# Patient Record
Sex: Male | Born: 1960
Health system: Southern US, Community
[De-identification: ages and names within clinical notes are randomized; demographics above are authoritative.]

## PROBLEM LIST (undated history)

## (undated) DIAGNOSIS — L309 Dermatitis, unspecified: Secondary | ICD-10-CM

## (undated) DIAGNOSIS — J45909 Unspecified asthma, uncomplicated: Secondary | ICD-10-CM

## (undated) DIAGNOSIS — F32A Depression, unspecified: Secondary | ICD-10-CM

## (undated) DIAGNOSIS — E079 Disorder of thyroid, unspecified: Secondary | ICD-10-CM

---

## 1979-10-28 HISTORY — PX: NOSE SURGERY: SHX723

## 1992-10-27 HISTORY — PX: FOOT SURGERY: SHX648

## 1998-07-21 ENCOUNTER — Emergency Department (HOSPITAL_COMMUNITY): Admission: EM | Admit: 1998-07-21 | Discharge: 1998-07-21 | Payer: Self-pay | Admitting: Emergency Medicine

## 1998-12-18 ENCOUNTER — Emergency Department (HOSPITAL_COMMUNITY): Admission: EM | Admit: 1998-12-18 | Discharge: 1998-12-18 | Payer: Self-pay

## 1999-02-06 ENCOUNTER — Emergency Department (HOSPITAL_COMMUNITY): Admission: EM | Admit: 1999-02-06 | Discharge: 1999-02-06 | Payer: Self-pay | Admitting: Emergency Medicine

## 2003-03-02 ENCOUNTER — Emergency Department (HOSPITAL_COMMUNITY): Admission: EM | Admit: 2003-03-02 | Discharge: 2003-03-02 | Payer: Self-pay | Admitting: Emergency Medicine

## 2006-05-17 ENCOUNTER — Emergency Department (HOSPITAL_COMMUNITY): Admission: EM | Admit: 2006-05-17 | Discharge: 2006-05-17 | Payer: Self-pay | Admitting: Emergency Medicine

## 2006-06-20 ENCOUNTER — Ambulatory Visit (HOSPITAL_COMMUNITY): Admission: RE | Admit: 2006-06-20 | Discharge: 2006-06-20 | Payer: Self-pay | Admitting: Family Medicine

## 2008-06-02 ENCOUNTER — Emergency Department (HOSPITAL_BASED_OUTPATIENT_CLINIC_OR_DEPARTMENT_OTHER): Admission: EM | Admit: 2008-06-02 | Discharge: 2008-06-02 | Payer: Self-pay | Admitting: Emergency Medicine

## 2009-06-08 ENCOUNTER — Encounter: Admission: RE | Admit: 2009-06-08 | Discharge: 2009-06-08 | Payer: Self-pay | Admitting: Family Medicine

## 2014-03-29 ENCOUNTER — Other Ambulatory Visit: Payer: Self-pay | Admitting: Family Medicine

## 2014-03-29 ENCOUNTER — Ambulatory Visit
Admission: RE | Admit: 2014-03-29 | Discharge: 2014-03-29 | Disposition: A | Discharge status: Home / Self Care | Payer: BC Managed Care – PPO | Source: Ambulatory Visit | Attending: Family Medicine | Admitting: Family Medicine

## 2014-03-29 DIAGNOSIS — Z111 Encounter for screening for respiratory tuberculosis: Secondary | ICD-10-CM

## 2014-03-29 DIAGNOSIS — R042 Hemoptysis: Secondary | ICD-10-CM

## 2014-04-03 ENCOUNTER — Other Ambulatory Visit: Payer: Self-pay | Admitting: Family Medicine

## 2014-04-03 DIAGNOSIS — R9389 Abnormal findings on diagnostic imaging of other specified body structures: Secondary | ICD-10-CM

## 2014-04-03 DIAGNOSIS — R7611 Nonspecific reaction to tuberculin skin test without active tuberculosis: Secondary | ICD-10-CM

## 2014-04-06 ENCOUNTER — Encounter (INDEPENDENT_AMBULATORY_CARE_PROVIDER_SITE_OTHER): Payer: Self-pay

## 2014-04-06 ENCOUNTER — Ambulatory Visit
Admission: RE | Admit: 2014-04-06 | Discharge: 2014-04-06 | Disposition: A | Discharge status: Home / Self Care | Payer: BC Managed Care – PPO | Source: Ambulatory Visit | Attending: Family Medicine | Admitting: Family Medicine

## 2014-04-06 DIAGNOSIS — R7611 Nonspecific reaction to tuberculin skin test without active tuberculosis: Secondary | ICD-10-CM

## 2014-04-06 DIAGNOSIS — R9389 Abnormal findings on diagnostic imaging of other specified body structures: Secondary | ICD-10-CM

## 2014-08-06 ENCOUNTER — Emergency Department (HOSPITAL_BASED_OUTPATIENT_CLINIC_OR_DEPARTMENT_OTHER)
Admission: EM | Admit: 2014-08-06 | Discharge: 2014-08-06 | Disposition: A | Discharge status: Home / Self Care | Payer: BC Managed Care – PPO | Attending: Emergency Medicine | Admitting: Emergency Medicine

## 2014-08-06 ENCOUNTER — Emergency Department (HOSPITAL_BASED_OUTPATIENT_CLINIC_OR_DEPARTMENT_OTHER): Payer: BC Managed Care – PPO

## 2014-08-06 ENCOUNTER — Encounter (HOSPITAL_BASED_OUTPATIENT_CLINIC_OR_DEPARTMENT_OTHER): Payer: Self-pay | Admitting: Emergency Medicine

## 2014-08-06 DIAGNOSIS — Z79899 Other long term (current) drug therapy: Secondary | ICD-10-CM | POA: Insufficient documentation

## 2014-08-06 DIAGNOSIS — R109 Unspecified abdominal pain: Secondary | ICD-10-CM

## 2014-08-06 DIAGNOSIS — E079 Disorder of thyroid, unspecified: Secondary | ICD-10-CM | POA: Insufficient documentation

## 2014-08-06 DIAGNOSIS — Z720 Tobacco use: Secondary | ICD-10-CM | POA: Insufficient documentation

## 2014-08-06 DIAGNOSIS — Z791 Long term (current) use of non-steroidal anti-inflammatories (NSAID): Secondary | ICD-10-CM | POA: Insufficient documentation

## 2014-08-06 DIAGNOSIS — J45909 Unspecified asthma, uncomplicated: Secondary | ICD-10-CM | POA: Insufficient documentation

## 2014-08-06 DIAGNOSIS — N201 Calculus of ureter: Secondary | ICD-10-CM

## 2014-08-06 HISTORY — DX: Disorder of thyroid, unspecified: E07.9

## 2014-08-06 HISTORY — DX: Unspecified asthma, uncomplicated: J45.909

## 2014-08-06 LAB — BASIC METABOLIC PANEL
ANION GAP: 15 (ref 5–15)
BUN: 13 mg/dL (ref 6–23)
CHLORIDE: 100 meq/L (ref 96–112)
CO2: 23 meq/L (ref 19–32)
CREATININE: 1.5 mg/dL — AB (ref 0.50–1.35)
Calcium: 9.9 mg/dL (ref 8.4–10.5)
GFR calc non Af Amer: 51 mL/min — ABNORMAL LOW (ref >90)
GFR, EST AFRICAN AMERICAN: 60 mL/min — AB (ref >90)
Glucose, Bld: 103 mg/dL — ABNORMAL HIGH (ref 70–99)
POTASSIUM: 4.3 meq/L (ref 3.7–5.3)
SODIUM: 138 meq/L (ref 137–147)

## 2014-08-06 LAB — URINALYSIS, ROUTINE W REFLEX MICROSCOPIC
BILIRUBIN URINE: NEGATIVE
Glucose, UA: NEGATIVE mg/dL
HGB URINE DIPSTICK: NEGATIVE
Ketones, ur: NEGATIVE mg/dL
Leukocytes, UA: NEGATIVE
NITRITE: NEGATIVE
PH: 7.5 (ref 5.0–8.0)
Protein, ur: NEGATIVE mg/dL
SPECIFIC GRAVITY, URINE: 1.014 (ref 1.005–1.030)
UROBILINOGEN UA: 0.2 mg/dL (ref 0.0–1.0)

## 2014-08-06 LAB — CBC WITH DIFFERENTIAL/PLATELET
BASOS PCT: 0 % (ref 0–1)
Basophils Absolute: 0 10*3/uL (ref 0.0–0.1)
Eosinophils Absolute: 0.1 10*3/uL (ref 0.0–0.7)
Eosinophils Relative: 2 % (ref 0–5)
HEMATOCRIT: 45.1 % (ref 39.0–52.0)
HEMOGLOBIN: 15.1 g/dL (ref 13.0–17.0)
LYMPHS ABS: 0.6 10*3/uL — AB (ref 0.7–4.0)
LYMPHS PCT: 9 % — AB (ref 12–46)
MCH: 26.5 pg (ref 26.0–34.0)
MCHC: 33.5 g/dL (ref 30.0–36.0)
MCV: 79.3 fL (ref 78.0–100.0)
MONO ABS: 0.4 10*3/uL (ref 0.1–1.0)
MONOS PCT: 6 % (ref 3–12)
NEUTROS ABS: 5.8 10*3/uL (ref 1.7–7.7)
NEUTROS PCT: 83 % — AB (ref 43–77)
Platelets: 219 10*3/uL (ref 150–400)
RBC: 5.69 MIL/uL (ref 4.22–5.81)
RDW: 14 % (ref 11.5–15.5)
WBC: 7 10*3/uL (ref 4.0–10.5)

## 2014-08-06 MED ORDER — SODIUM CHLORIDE 0.9 % IV SOLN
INTRAVENOUS | Status: DC
Start: 2014-08-06 — End: 2014-08-06
  Administered 2014-08-06: 14:00:00 via INTRAVENOUS

## 2014-08-06 MED ORDER — HYDROMORPHONE HCL 1 MG/ML IJ SOLN
1.0000 mg | Freq: Once | INTRAMUSCULAR | Status: AC
  Administered 2014-08-06: 1 mg via INTRAVENOUS
  Filled 2014-08-06: qty 1

## 2014-08-06 MED ORDER — ONDANSETRON HCL 4 MG/2ML IJ SOLN
4.0000 mg | Freq: Once | INTRAMUSCULAR | Status: AC
  Administered 2014-08-06: 4 mg via INTRAVENOUS
  Filled 2014-08-06: qty 2

## 2014-08-06 MED ORDER — NAPROXEN 500 MG PO TABS: 500.0000 mg | ORAL_TABLET | Freq: Two times a day (BID) | ORAL | Status: DC

## 2014-08-06 MED ORDER — HYDROCODONE-ACETAMINOPHEN 5-325 MG PO TABS: 1.0000 | ORAL_TABLET | Freq: Four times a day (QID) | ORAL | Status: DC | PRN

## 2014-08-06 MED ORDER — SODIUM CHLORIDE 0.9 % IV BOLUS (SEPSIS)
1000.0000 mL | Freq: Once | INTRAVENOUS | Status: AC
  Administered 2014-08-06: 1000 mL via INTRAVENOUS

## 2014-08-06 MED ORDER — ONDANSETRON 4 MG PO TBDP: 4.0000 mg | ORAL_TABLET | Freq: Three times a day (TID) | ORAL | Status: DC | PRN

## 2014-08-06 NOTE — ED Notes (Signed)
Patient states he is having LLQ pain since about 9am.

## 2014-08-06 NOTE — Discharge Instructions (Signed)
Work note provided. Take pain medicine and Naprosyn as needed. Also have Zofran for nausea and vomiting. Followup with urologist if not improved in 24 hours. Return for any new or worse symptoms.

## 2014-08-06 NOTE — ED Provider Notes (Signed)
CSN: [id number]     Arrival date & time 08/06/14  1235 History   First MD Initiated Contact with Patient 08/06/14 1250     Chief Complaint  Patient presents with  . Abdominal Pain     (Consider location/radiation/quality/duration/timing/severity/associated sxs/prior Treatment) Patient is a 53 y.o. male presenting with abdominal pain. The history is provided by the patient and the spouse.  Abdominal Pain Associated symptoms: dysuria, nausea and vomiting   Associated symptoms: no chest pain, no fever and no shortness of breath    patient with acute onset of left-sided abdominal pain 10 out of 10 radiating to the back radiating down into the left groin area. Patient seemed to think that the pain started just lateral to the umbilicus on the left side. Associated with nausea and vomiting no fevers. Discomfort with urinating but no blood noted in the urine. Patient without history of similar abdominal pain. No history kidney stones. No family history of kidney stones.  Past Medical History  Diagnosis Date  . Asthma   . Thyroid disease    History reviewed. No pertinent past surgical history. No family history on file. History  Substance Use Topics  . Smoking status: Current Every Day Smoker  . Smokeless tobacco: Not on file  . Alcohol Use: Yes     Comment: every few weeks    Review of Systems  Constitutional: Negative for fever.  HENT: Negative for congestion.   Eyes: Negative for visual disturbance.  Respiratory: Negative for shortness of breath.   Cardiovascular: Negative for chest pain.  Gastrointestinal: Positive for nausea, vomiting and abdominal pain.  Genitourinary: Positive for dysuria.  Musculoskeletal: Positive for back pain.  Skin: Negative for rash.  Neurological: Negative for syncope and headaches.  Hematological: Does not bruise/bleed easily.  Psychiatric/Behavioral: Negative for confusion.      Allergies  Review of patient's allergies indicates no known  allergies.  Home Medications   Prior to Admission medications   Medication Sig Start Date End Date Taking? Authorizing Provider  albuterol (PROVENTIL HFA;VENTOLIN HFA) 108 (90 BASE) MCG/ACT inhaler Inhale into the lungs every 6 (six) hours as needed for wheezing or shortness of breath.   Yes Historical Provider, MD  levothyroxine (SYNTHROID, LEVOTHROID) 88 MCG tablet Take 88 mcg by mouth daily before breakfast.   Yes Historical Provider, MD  mometasone (ELOCON) 0.1 % cream Apply 1 application topically daily.   Yes Historical Provider, MD  HYDROcodone-acetaminophen (NORCO/VICODIN) 5-325 MG per tablet Take 1-2 tablets by mouth every 6 (six) hours as needed for moderate pain. 08/06/14   Vanetta Mulders, MD  naproxen (NAPROSYN) 500 MG tablet Take 1 tablet (500 mg total) by mouth 2 (two) times daily. 08/06/14   Vanetta Mulders, MD  ondansetron (ZOFRAN ODT) 4 MG disintegrating tablet Take 1 tablet (4 mg total) by mouth every 8 (eight) hours as needed for nausea or vomiting. 08/06/14   Vanetta Mulders, MD   BP 128/94  Pulse 73  Temp(Src) 98 F (36.7 C) (Oral)  Resp 18  Ht 5\' 9"  (1.753 m)  Wt 160 lb (72.576 kg)  BMI 23.62 kg/m2  SpO2 95% Physical Exam  Nursing note and vitals reviewed. Constitutional: He is oriented to person, place, and time. He appears well-developed and well-nourished. He appears distressed.  HENT:  Head: Normocephalic and atraumatic.  Eyes: Conjunctivae and EOM are normal. Pupils are equal, round, and reactive to light.  Neck: Normal range of motion.  Cardiovascular: Normal rate, regular rhythm and normal heart sounds.   Pulmonary/Chest:  Effort normal and breath sounds normal. No respiratory distress.  Abdominal: Soft. Bowel sounds are normal. There is tenderness.  Tender to palpation on the left side of the abdomen.  Musculoskeletal: Normal range of motion.  Neurological: He is alert and oriented to person, place, and time. No cranial nerve deficit. He exhibits normal  muscle tone. Coordination normal.  Skin: Skin is warm. No rash noted.    ED Course  Procedures (including critical care time) Labs Review Labs Reviewed  BASIC METABOLIC PANEL - Abnormal; Notable for the following:    Glucose, Bld 103 (*)    Creatinine, Ser 1.50 (*)    GFR calc non Af Amer 51 (*)    GFR calc Af Amer 60 (*)    All other components within normal limits  CBC WITH DIFFERENTIAL - Abnormal; Notable for the following:    Neutrophils Relative % 83 (*)    Lymphocytes Relative 9 (*)    Lymphs Abs 0.6 (*)    All other components within normal limits  URINALYSIS, ROUTINE W REFLEX MICROSCOPIC    Imaging Review Ct Renal Stone Study  08/06/2014   CLINICAL DATA:  Left lower quadrant and left flank pain of acute onset  EXAM: CT ABDOMEN AND PELVIS WITHOUT CONTRAST  TECHNIQUE: Multidetector CT imaging of the abdomen and pelvis was performed following the standard protocol without oral or intravenous contrast material administration.  COMPARISON:  None.  FINDINGS: On axial slice 8 series 4, there is a 3 mm nodular opacity in the anterior segment of the left lower lobe. Lung bases are otherwise clear.  No focal liver lesions are identified on this noncontrast enhanced study. Gallbladder wall is not thickened. There is no biliary duct dilatation.  Spleen, pancreas, and adrenals appear normal.  Right kidney shows no evidence of mass or hydronephrosis. There is no right-sided renal or ureteral calculus.  Left kidney appears edematous with perinephric stranding on the left. There is mild hydronephrosis on the left. There is no intrarenal mass or calculus on the left. There is a 3 mm calculus at the left ureterovesical junction. No other ureteral calculi are identified.  In the pelvis the urinary bladder wall is slightly thickened. There is a single small calcification in the left side of the prostate gland. There is no pelvic mass or fluid collection. The appendix appears normal. Terminal ileum  appears normal.  There is no bowel obstruction.  No free air or portal venous air.  There is no ascites, adenopathy, or abscess in the abdomen or pelvis. There is no appreciable abdominal aortic aneurysm. There is degenerative change in the lumbar spine. There are no blastic or lytic bone lesions.  IMPRESSION: 3 mm calculus left ureterovesical junction with mild hydronephrosis on the left. There is left renal edema and moderate perinephric stranding on the left.  Mild thickening of the wall of the urinary bladder is noted. Suspect a degree of cystitis.  No bowel obstruction.  No abscess.  Appendix region appears normal.  3 mm nodular opacity anterior left base region. Followup of this small nodular opacity should be based on Fleischner Society guidelines. If the patient is at high risk for bronchogenic carcinoma, follow-up chest CT at 1 year is recommended. If the patient is at low risk, no follow-up is needed. This recommendation follows the consensus statement: Guidelines for Management of Small Pulmonary Nodules Detected on CT Scans: A Statement from the Fleischner Society as published in Radiology 2005; 237:395-400.   Electronically Signed   By: Chrissie NoaWilliam  Margarita GrizzleWoodruff M.D.   On: 08/06/2014 14:21     EKG Interpretation None      MDM   Final diagnoses:  Left ureteral stone    Patient with acute onset of left-sided abdominal pain left back pain and left groin pain this morning at 9:00. Consistent with the renal stone. CT scan confirmed a 3 mm stone in distal ureter. Associated with some hydronephrosis. Based on that side patient should pass it. Renal function creatinine elevated slightly at 1.5. No other flexor right abnormalities. Patient improved significantly with pain medicine and nausea medicine. Patient has urologist to followup with already. Patient has not had a history of stones in the past.    Vanetta MuldersScott Zonnie Landen, MD 08/06/14 [phone]

## 2015-11-09 IMAGING — CT CT RENAL STONE PROTOCOL
2 of 4 series · 15 of 46 positions shown, 17 images · non-contrast
Comparison: None.

CLINICAL DATA: Left lower quadrant and left flank pain of acute
onset

EXAM:
CT ABDOMEN AND PELVIS WITHOUT CONTRAST
TECHNIQUE: Multidetector CT imaging of the abdomen and pelvis was performed
following the standard protocol without oral or intravenous contrast
material administration.

[Series 2: renal stone > 200 lbs 5.0 b31f · axial · 0.68mm/px · z∈[-420,-35]mm · 12 of 89 slices shown, 14 images]
[im 8/89  soft-tissue]
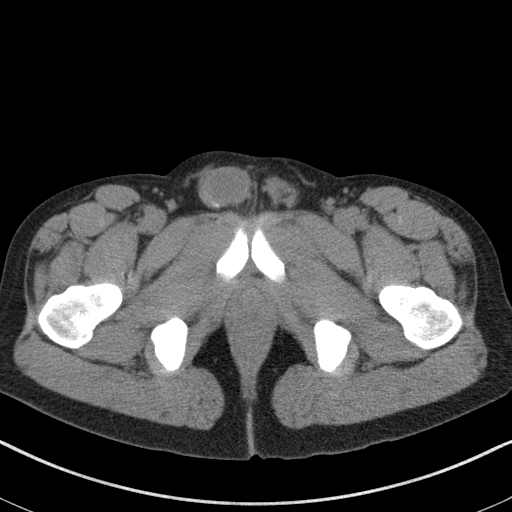
[im 8/89  bone]
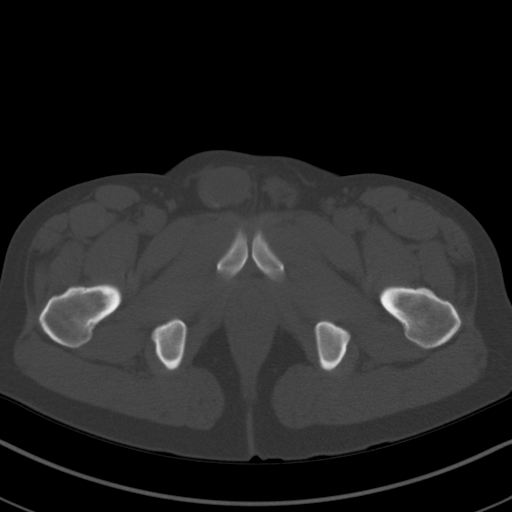
[im 15/89  soft-tissue]
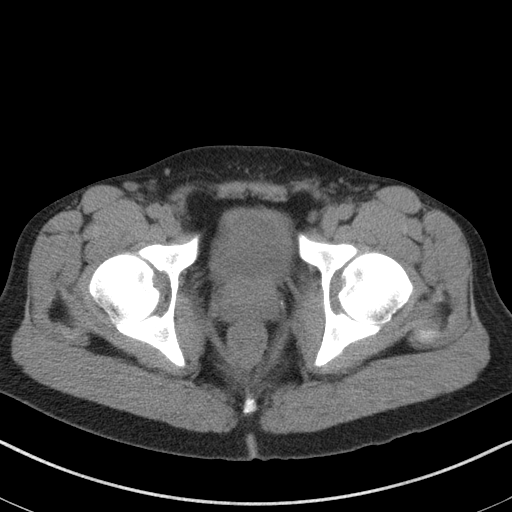
[im 22/89  soft-tissue]
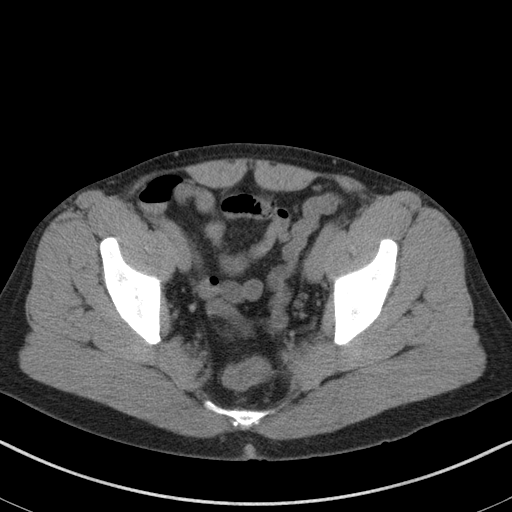
[im 29/89  soft-tissue]
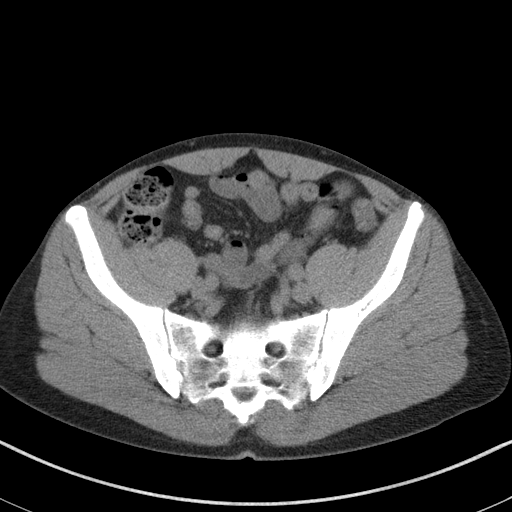
[im 36/89  soft-tissue]
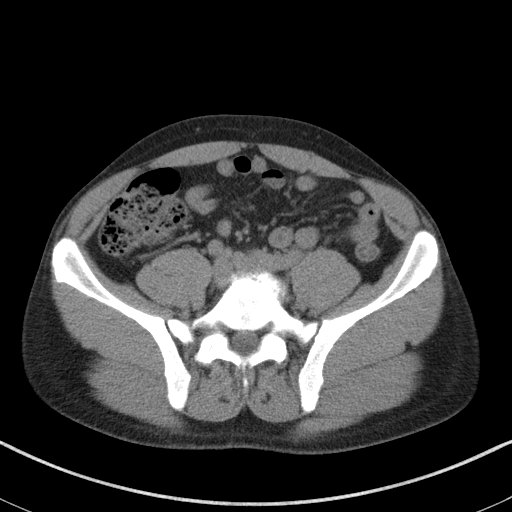
[im 43/89  soft-tissue]
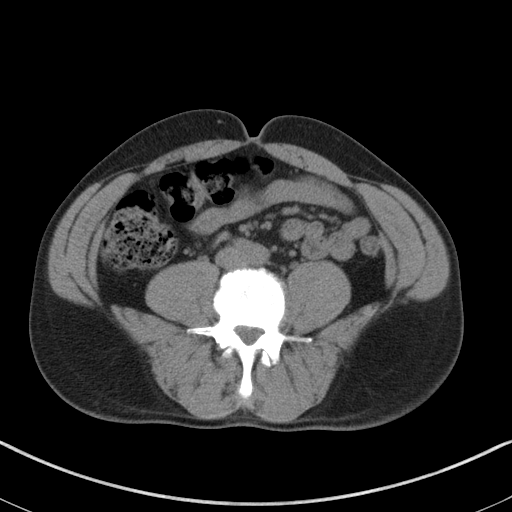
[im 50/89  soft-tissue]
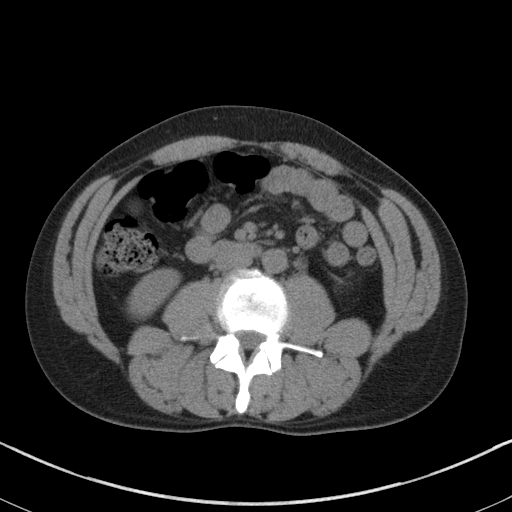
[im 57/89  soft-tissue]
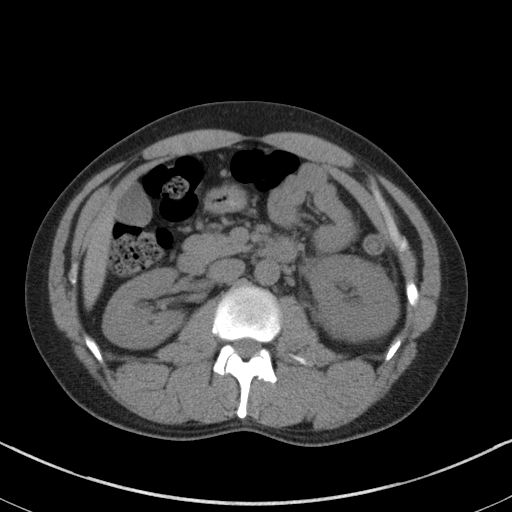
[im 64/89  soft-tissue]
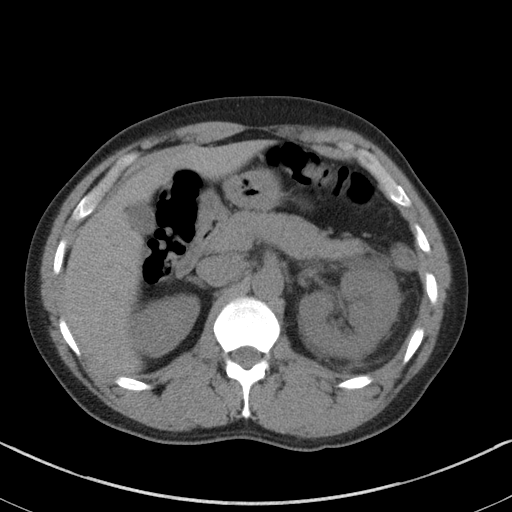
[im 64/89  bone]
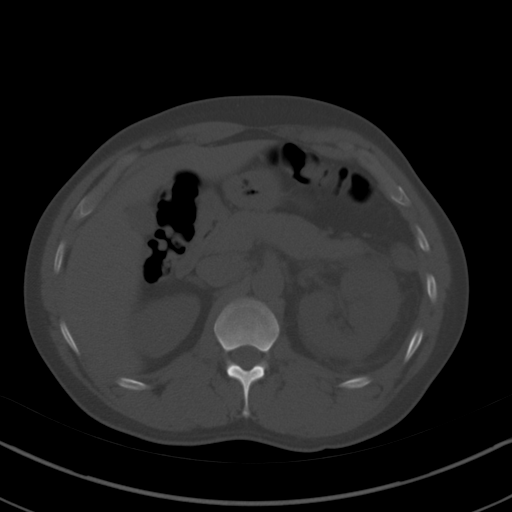
[im 71/89  soft-tissue]
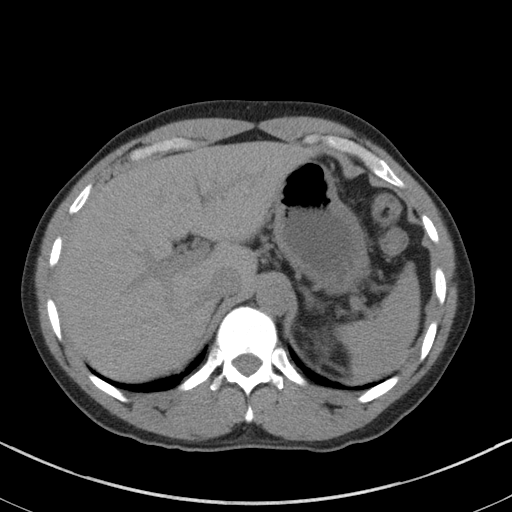
[im 78/89  soft-tissue]
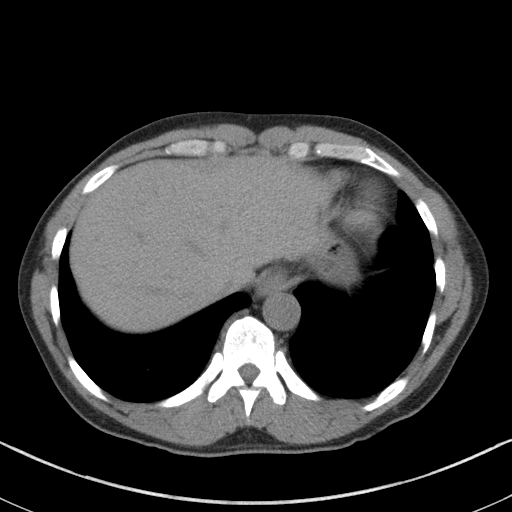
[im 85/89  soft-tissue]
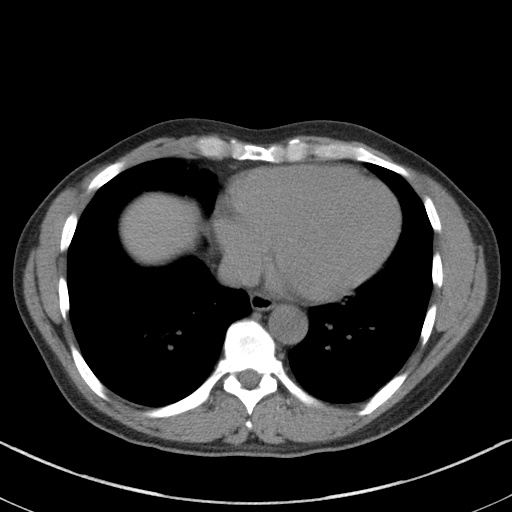

[Series 5: renal stone 3.0 coronal · coronal · 0.80mm/px · 3 of 76 slices shown]
[im 26/76  soft-tissue]
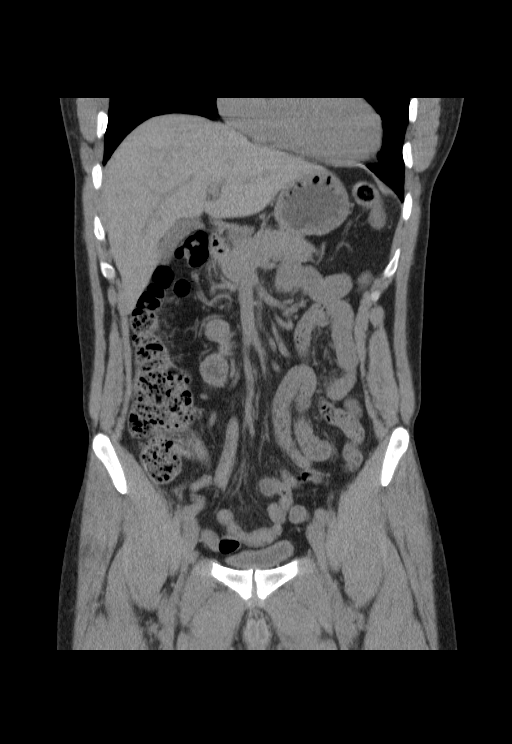
[im 34/76  soft-tissue]
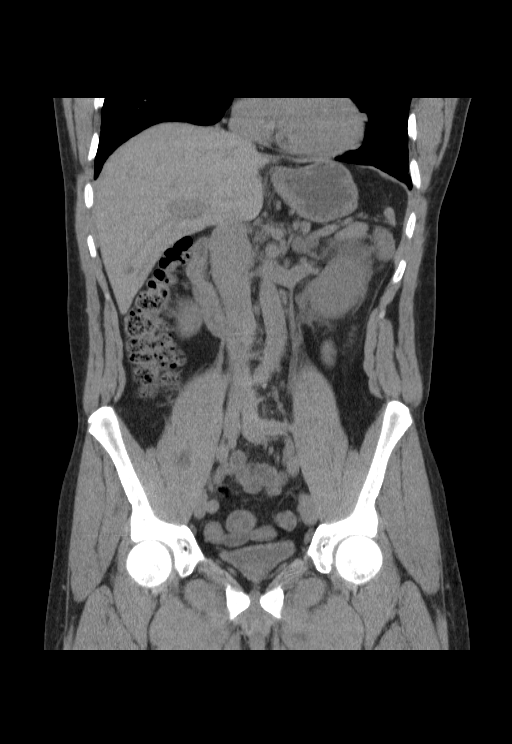
[im 42/76  soft-tissue]
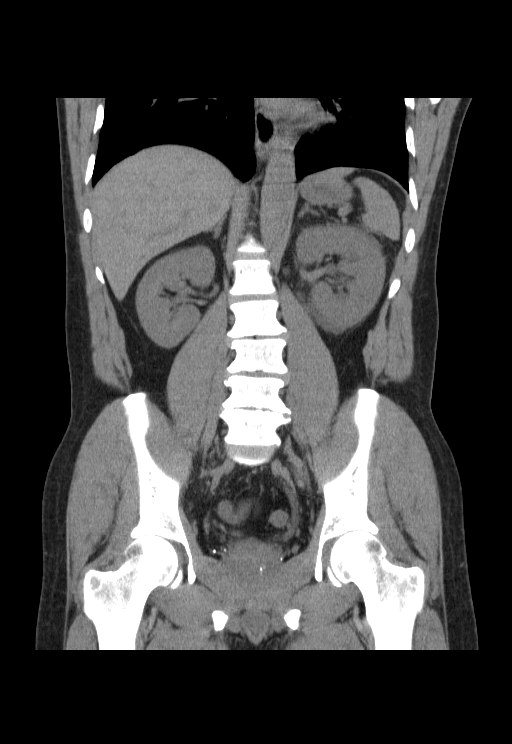

[15 of 46 positions shown; findings below may reference images not displayed]

FINDINGS: On axial slice 8 series 4, there is a 3 mm nodular opacity in the
anterior segment of the left lower lobe. Lung bases are otherwise
clear.

No focal liver lesions are identified on this noncontrast enhanced
study. Gallbladder wall is not thickened. There is no biliary duct
dilatation.

Spleen, pancreas, and adrenals appear normal.

Right kidney shows no evidence of mass or hydronephrosis. There is
no right-sided renal or ureteral calculus.

Left kidney appears edematous with perinephric stranding on the
left. There is mild hydronephrosis on the left. There is no
intrarenal mass or calculus on the left. There is a 3 mm calculus at
the left ureterovesical junction. No other ureteral calculi are
identified.

In the pelvis the urinary bladder wall is slightly thickened. There
is a single small calcification in the left side of the prostate
gland. There is no pelvic mass or fluid collection. The appendix
appears normal. Terminal ileum appears normal.

There is no bowel obstruction.  No free air or portal venous air.

There is no ascites, adenopathy, or abscess in the abdomen or
pelvis. There is no appreciable abdominal aortic aneurysm. There is
degenerative change in the lumbar spine. There are no blastic or
lytic bone lesions.
IMPRESSION: 3 mm calculus left ureterovesical junction with mild hydronephrosis
on the left. There is left renal edema and moderate perinephric
stranding on the left.

Mild thickening of the wall of the urinary bladder is noted. Suspect
a degree of cystitis.

No bowel obstruction.  No abscess.  Appendix region appears normal.

3 mm nodular opacity anterior left base region. Followup of this
small nodular opacity should be based on [HOSPITAL]
guidelines. If the patient is at high risk for bronchogenic
carcinoma, follow-up chest CT at 1 year is recommended. If the
patient is at low risk, no follow-up is needed. This recommendation
follows the consensus statement: Guidelines for Management of Small
Pulmonary Nodules Detected on CT Scans: A Statement from the

## 2015-12-18 ENCOUNTER — Encounter (HOSPITAL_BASED_OUTPATIENT_CLINIC_OR_DEPARTMENT_OTHER): Payer: Self-pay | Admitting: Emergency Medicine

## 2015-12-18 ENCOUNTER — Emergency Department (HOSPITAL_BASED_OUTPATIENT_CLINIC_OR_DEPARTMENT_OTHER)
Admission: EM | Admit: 2015-12-18 | Discharge: 2015-12-18 | Disposition: A | Discharge status: Home / Self Care | Payer: 59 | Attending: Emergency Medicine | Admitting: Emergency Medicine

## 2015-12-18 ENCOUNTER — Emergency Department (HOSPITAL_BASED_OUTPATIENT_CLINIC_OR_DEPARTMENT_OTHER): Payer: 59

## 2015-12-18 DIAGNOSIS — Z87891 Personal history of nicotine dependence: Secondary | ICD-10-CM | POA: Diagnosis not present

## 2015-12-18 DIAGNOSIS — Z7952 Long term (current) use of systemic steroids: Secondary | ICD-10-CM | POA: Diagnosis not present

## 2015-12-18 DIAGNOSIS — Z79899 Other long term (current) drug therapy: Secondary | ICD-10-CM | POA: Insufficient documentation

## 2015-12-18 DIAGNOSIS — Z791 Long term (current) use of non-steroidal anti-inflammatories (NSAID): Secondary | ICD-10-CM | POA: Diagnosis not present

## 2015-12-18 DIAGNOSIS — E079 Disorder of thyroid, unspecified: Secondary | ICD-10-CM | POA: Diagnosis not present

## 2015-12-18 DIAGNOSIS — J45901 Unspecified asthma with (acute) exacerbation: Secondary | ICD-10-CM | POA: Insufficient documentation

## 2015-12-18 DIAGNOSIS — R0602 Shortness of breath: Secondary | ICD-10-CM | POA: Diagnosis present

## 2015-12-18 MED ORDER — ALBUTEROL SULFATE (2.5 MG/3ML) 0.083% IN NEBU
5.0000 mg | INHALATION_SOLUTION | Freq: Once | RESPIRATORY_TRACT | Status: AC
  Administered 2015-12-18: 5 mg via RESPIRATORY_TRACT
  Filled 2015-12-18: qty 6

## 2015-12-18 MED ORDER — IPRATROPIUM-ALBUTEROL 0.5-2.5 (3) MG/3ML IN SOLN
RESPIRATORY_TRACT | Status: AC
  Administered 2015-12-18: 3 mL
  Filled 2015-12-18: qty 3

## 2015-12-18 MED ORDER — PREDNISONE 20 MG PO TABS: 40.0000 mg | ORAL_TABLET | Freq: Every day | ORAL | Status: DC

## 2015-12-18 MED ORDER — IPRATROPIUM BROMIDE 0.02 % IN SOLN
0.5000 mg | Freq: Once | RESPIRATORY_TRACT | Status: AC
  Administered 2015-12-18: 0.5 mg via RESPIRATORY_TRACT
  Filled 2015-12-18: qty 2.5

## 2015-12-18 MED ORDER — ALBUTEROL SULFATE (2.5 MG/3ML) 0.083% IN NEBU
INHALATION_SOLUTION | RESPIRATORY_TRACT | Status: AC
  Administered 2015-12-18: 2.5 mg
  Filled 2015-12-18: qty 3

## 2015-12-18 MED ORDER — METHYLPREDNISOLONE SODIUM SUCC 125 MG IJ SOLR
125.0000 mg | Freq: Once | INTRAMUSCULAR | Status: AC
  Administered 2015-12-18: 125 mg via INTRAVENOUS
  Filled 2015-12-18: qty 2

## 2015-12-18 MED ORDER — MAGNESIUM SULFATE 2 GM/50ML IV SOLN
2.0000 g | Freq: Once | INTRAVENOUS | Status: AC
  Administered 2015-12-18: 2 g via INTRAVENOUS
  Filled 2015-12-18: qty 50

## 2015-12-18 NOTE — ED Provider Notes (Addendum)
CSN: [id number]     Arrival date & time 12/18/15  1556 History   First MD Initiated Contact with Patient 12/18/15 1638     Chief Complaint  Patient presents with  . Shortness of Breath     (Consider location/radiation/quality/duration/timing/severity/associated sxs/prior Treatment) Patient is a 55 y.o. male presenting with shortness of breath. The history is provided by the patient and the spouse.  Shortness of Breath Severity:  Severe Onset quality:  Gradual Duration:  1 day Timing:  Constant Progression:  Worsening Chronicity:  Recurrent Context: known allergens   Context comment:  Pt was outside a lot this weekend trimming bushes Relieved by:  Nothing Worsened by:  Activity and exertion (lying down at night) Ineffective treatments:  Inhaler Associated symptoms: cough and wheezing   Associated symptoms: no chest pain, no fever, no sputum production, no swollen glands and no vomiting   Risk factors: no hx of PE/DVT, no prolonged immobilization, no recent surgery and no tobacco use     Past Medical History  Diagnosis Date  . Asthma   . Thyroid disease    Past Surgical History  Procedure Laterality Date  . Nose surgery     No family history on file. Social History  Substance Use Topics  . Smoking status: Former Games developer  . Smokeless tobacco: None  . Alcohol Use: Yes     Comment: every few weeks    Review of Systems  Constitutional: Negative for fever.  Respiratory: Positive for cough, shortness of breath and wheezing. Negative for sputum production.   Cardiovascular: Negative for chest pain.  Gastrointestinal: Negative for vomiting.  All other systems reviewed and are negative.     Allergies  Review of patient's allergies indicates no known allergies.  Home Medications   Prior to Admission medications   Medication Sig Start Date End Date Taking? Authorizing Provider  albuterol (PROVENTIL HFA;VENTOLIN HFA) 108 (90 BASE) MCG/ACT inhaler Inhale into the lungs  every 6 (six) hours as needed for wheezing or shortness of breath.    Historical Provider, MD  HYDROcodone-acetaminophen (NORCO/VICODIN) 5-325 MG per tablet Take 1-2 tablets by mouth every 6 (six) hours as needed for moderate pain. 08/06/14   Vanetta Mulders, MD  levothyroxine (SYNTHROID, LEVOTHROID) 88 MCG tablet Take 88 mcg by mouth daily before breakfast.    Historical Provider, MD  mometasone (ELOCON) 0.1 % cream Apply 1 application topically daily.    Historical Provider, MD  naproxen (NAPROSYN) 500 MG tablet Take 1 tablet (500 mg total) by mouth 2 (two) times daily. 08/06/14   Vanetta Mulders, MD  ondansetron (ZOFRAN ODT) 4 MG disintegrating tablet Take 1 tablet (4 mg total) by mouth every 8 (eight) hours as needed for nausea or vomiting. 08/06/14   Vanetta Mulders, MD   BP 124/78 mmHg  Pulse 73  Temp(Src) 97.5 F (36.4 C) (Oral)  Resp 18  Ht  (1.753 m)  Wt 165 lb (74.844 kg)  BMI 24.36 kg/m2  SpO2 98% Physical Exam  Constitutional: He is oriented to person, place, and time. He appears well-developed and well-nourished. No distress.  HENT:  Head: Normocephalic and atraumatic.  Mouth/Throat: Oropharynx is clear and moist.  Eyes: Conjunctivae and EOM are normal. Pupils are equal, round, and reactive to light.  Neck: Normal range of motion. Neck supple.  Cardiovascular: Normal rate, regular rhythm and intact distal pulses.   No murmur heard. Pulmonary/Chest: Effort normal. Tachypnea noted. No respiratory distress. He has decreased breath sounds. He has wheezes. He has no rales.  Abdominal: Soft. He exhibits no distension. There is no tenderness. There is no rebound and no guarding.  Musculoskeletal: Normal range of motion. He exhibits no edema or tenderness.  Neurological: He is alert and oriented to person, place, and time.  Skin: Skin is warm and dry. No rash noted. No erythema.  Psychiatric: He has a normal mood and affect. His behavior is normal.  Nursing note and vitals  reviewed.   ED Course  Procedures (including critical care time) Labs Review Labs Reviewed - No data to display  Imaging Review Dg Chest 2 View  12/18/2015  CLINICAL DATA:  Asthma.  Shortness of breath. EXAM: CHEST  2 VIEW COMPARISON:  04/06/2014 FINDINGS: Calcified right infrahilar lymph node. Old granulomatous disease with calcified granuloma in the right lower lobe. Cardiac and mediastinal margins appear normal. The lungs appear otherwise clear. IMPRESSION: 1. No acute thoracic findings. Electronically Signed   By: Gaylyn Rong M.D.   On: 12/18/2015 16:52   I have personally reviewed and evaluated these images and lab results as part of my medical decision-making.   EKG Interpretation None      MDM   Final diagnoses:  Asthma exacerbation    Pt with typical asthma exacerbation  Symptoms starting yesterday.  Long hx of asthma on advair and albuterol which are not helping at home.  No infectious sx, productive cough or other complaints.  Severely diminished breath sounds on exam.  will give steroids, mag, albuterol/atrovent and recheck. EKG and CXR without acute findings.   6:25 PM Improvement with mag, steroids and a second albuterol and Atrovent. Now breath sounds are heard throughout the lung field still slightly tight but improved. Patient is able to ambulate without desaturation or tachypnea. He states he is feeling better.  Gwyneth Sprout, MD 12/18/15 4034  Gwyneth Sprout, MD 12/18/15 [phone]

## 2015-12-18 NOTE — Discharge Instructions (Signed)
Asthma, Adult Asthma is a condition of the lungs in which the airways tighten and narrow. Asthma can make it hard to breathe. Asthma cannot be cured, but medicine and lifestyle changes can help control it. Asthma may be started (triggered) by:  Animal skin flakes (dander).  Dust.  Cockroaches.  Pollen.  Mold.  Smoke.  Cleaning products.  Hair sprays or aerosol sprays.  Paint fumes or strong smells.  Cold air, weather changes, and winds.  Crying or laughing hard.  Stress.  Certain medicines or drugs.  Foods, such as dried fruit, potato chips, and sparkling grape juice.  Infections or conditions (colds, flu).  Exercise.  Certain medical conditions or diseases.  Exercise or tiring activities. HOME CARE   Take medicine as told by your doctor.  Use a peak flow meter as told by your doctor. A peak flow meter is a tool that measures how well the lungs are working.  Record and keep track of the peak flow meter's readings.  Understand and use the asthma action plan. An asthma action plan is a written plan for taking care of your asthma and treating your attacks.  To help prevent asthma attacks:  Do not smoke. Stay away from secondhand smoke.  Change your heating and air conditioning filter often.  Limit your use of fireplaces and wood stoves.  Get rid of pests (such as roaches and mice) and their droppings.  Throw away plants if you see mold on them.  Clean your floors. Dust regularly. Use cleaning products that do not smell.  Have someone vacuum when you are not home. Use a vacuum cleaner with a HEPA filter if possible.  Replace carpet with wood, tile, or vinyl flooring. Carpet can trap animal skin flakes and dust.  Use allergy-proof pillows, mattress covers, and box spring covers.  Wash bed sheets and blankets every week in hot water and dry them in a dryer.  Use blankets that are made of polyester or cotton.  Clean bathrooms and kitchens with bleach.  If possible, have someone repaint the walls in these rooms with mold-resistant paint. Keep out of the rooms that are being cleaned and painted.  Wash hands often. GET HELP IF:  You have make a whistling sound when breaking (wheeze), have shortness of breath, or have a cough even if taking medicine to prevent attacks.  The colored mucus you cough up (sputum) is thicker than usual.  The colored mucus you cough up changes from clear or white to yellow, green, gray, or bloody.  You have problems from the medicine you are taking such as:  A rash.  Itching.  Swelling.  Trouble breathing.  You need reliever medicines more than 2-3 times a week.  Your peak flow measurement is still at 50-79% of your personal best after following the action plan for 1 hour.  You have a fever. GET HELP RIGHT AWAY IF:   You seem to be worse and are not responding to medicine during an asthma attack.  You are short of breath even at rest.  You get short of breath when doing very little activity.  You have trouble eating, drinking, or talking.  You have chest pain.  You have a fast heartbeat.  Your lips or fingernails start to turn blue.  You are light-headed, dizzy, or faint.  Your peak flow is less than 50% of your personal best.   This information is not intended to replace advice given to you by your health care provider. Make sure   you discuss any questions you have with your health care provider.   Document Released: 03/31/2008 Document Revised: 07/04/2015 Document Reviewed: 05/12/2013 Elsevier Interactive Patient Education 2016 [employer]  

## 2015-12-18 NOTE — ED Notes (Signed)
SOB and tightness since last night.  Neb tx at home not working.  Has used 2 nebs plus the inhaler with no improvement.

## 2016-05-11 ENCOUNTER — Emergency Department (HOSPITAL_BASED_OUTPATIENT_CLINIC_OR_DEPARTMENT_OTHER): Payer: 59

## 2016-05-11 ENCOUNTER — Encounter (HOSPITAL_BASED_OUTPATIENT_CLINIC_OR_DEPARTMENT_OTHER): Payer: Self-pay | Admitting: *Deleted

## 2016-05-11 ENCOUNTER — Emergency Department (HOSPITAL_BASED_OUTPATIENT_CLINIC_OR_DEPARTMENT_OTHER)
Admission: EM | Admit: 2016-05-11 | Discharge: 2016-05-11 | Disposition: A | Discharge status: Home / Self Care | Payer: 59 | Attending: Emergency Medicine | Admitting: Emergency Medicine

## 2016-05-11 DIAGNOSIS — S61411A Laceration without foreign body of right hand, initial encounter: Secondary | ICD-10-CM | POA: Diagnosis present

## 2016-05-11 DIAGNOSIS — Z87891 Personal history of nicotine dependence: Secondary | ICD-10-CM | POA: Diagnosis not present

## 2016-05-11 DIAGNOSIS — J45909 Unspecified asthma, uncomplicated: Secondary | ICD-10-CM | POA: Diagnosis not present

## 2016-05-11 DIAGNOSIS — L03113 Cellulitis of right upper limb: Secondary | ICD-10-CM | POA: Insufficient documentation

## 2016-05-11 DIAGNOSIS — Y929 Unspecified place or not applicable: Secondary | ICD-10-CM | POA: Diagnosis not present

## 2016-05-11 DIAGNOSIS — Y999 Unspecified external cause status: Secondary | ICD-10-CM | POA: Diagnosis not present

## 2016-05-11 DIAGNOSIS — Y939 Activity, unspecified: Secondary | ICD-10-CM | POA: Diagnosis not present

## 2016-05-11 DIAGNOSIS — IMO0002 Reserved for concepts with insufficient information to code with codable children: Secondary | ICD-10-CM

## 2016-05-11 DIAGNOSIS — W260XXA Contact with knife, initial encounter: Secondary | ICD-10-CM | POA: Insufficient documentation

## 2016-05-11 MED ORDER — CEPHALEXIN 500 MG PO CAPS: 500.0000 mg | ORAL_CAPSULE | Freq: Two times a day (BID) | ORAL | Status: DC

## 2016-05-11 MED ORDER — IBUPROFEN 800 MG PO TABS: 800.0000 mg | ORAL_TABLET | Freq: Three times a day (TID) | ORAL | Status: DC

## 2016-05-11 MED ORDER — TETANUS-DIPHTH-ACELL PERTUSSIS 5-2.5-18.5 LF-MCG/0.5 IM SUSP
0.5000 mL | Freq: Once | INTRAMUSCULAR | Status: AC
  Administered 2016-05-11: 0.5 mL via INTRAMUSCULAR
  Filled 2016-05-11: qty 0.5

## 2016-05-11 NOTE — ED Notes (Signed)
Laceration to right hand last night.  Bleeding controlled.

## 2016-05-11 NOTE — ED Provider Notes (Signed)
CSN: [id number]     Arrival date & time 05/11/16  1337 History   First MD Initiated Contact with Patient 05/11/16 1503     Chief Complaint  Patient presents with  . Extremity Laceration   Patient is a 55 y.o. male presenting with skin laceration.  Laceration Location:  Shoulder/arm Shoulder/arm laceration location:  R hand Length (cm):  1.5 Depth:  Cutaneous Quality: straight   Bleeding: controlled   Time since incident:  1 day Laceration mechanism:  Knife Foreign body present:  No foreign bodies Relieved by:  Pressure Worsened by:  Movement Tetanus status:  Out of date   Mr. Jablonowski is a 55 year old male presenting with laceration. Patient states he was using an X-Acto blade last evening when he slipped and cut the dorsal surface of his right hand. The laceration is distal to the thumb MCP joint. Laceration occurred over 12 hours ago. He applied pressure which stopped the bleeding last evening. He presents today because he woke with redness of the skin around the laceration. Denies purulent drainage from the wound. Laceration continues to ooze blood when he moves his thumb. Denies use of blood thinners. Denies swelling of the hand or inability to move the thumb. He is unsure of his last tetanus shot. Denies fevers, chills, nausea or vomiting.  Past Medical History  Diagnosis Date  . Asthma   . Thyroid disease    Past Surgical History  Procedure Laterality Date  . Nose surgery     History reviewed. No pertinent family history. Social History  Substance Use Topics  . Smoking status: Former Games developer  . Smokeless tobacco: None  . Alcohol Use: Yes     Comment: every few weeks    Review of Systems  All other systems reviewed and are negative.     Allergies  Review of patient's allergies indicates no known allergies.  Home Medications   Prior to Admission medications   Medication Sig Start Date End Date Taking? Authorizing Provider  albuterol (PROVENTIL HFA;VENTOLIN  HFA) 108 (90 BASE) MCG/ACT inhaler Inhale into the lungs every 6 (six) hours as needed for wheezing or shortness of breath.    Historical Provider, MD  cephALEXin (KEFLEX) 500 MG capsule Take 1 capsule (500 mg total) by mouth 2 (two) times daily. 05/11/16   Jaclyne Haverstick, PA-C  ibuprofen (ADVIL,MOTRIN) 800 MG tablet Take 1 tablet (800 mg total) by mouth 3 (three) times daily. 05/11/16   Nadav Swindell, PA-C  levothyroxine (SYNTHROID, LEVOTHROID) 88 MCG tablet Take 88 mcg by mouth daily before breakfast.    Historical Provider, MD  mometasone (ELOCON) 0.1 % cream Apply 1 application topically daily.    Historical Provider, MD   BP 122/89 mmHg  Pulse 88  Temp(Src) 98.9 F (37.2 C) (Oral)  Resp 18  Ht  (1.778 m)  Wt 77.111 kg  BMI 24.39 kg/m2  SpO2 96% Physical Exam  Constitutional: He appears well-developed and well-nourished. No distress.  HENT:  Head: Normocephalic and atraumatic.  Eyes: Conjunctivae are normal. Right eye exhibits no discharge. Left eye exhibits no discharge. No scleral icterus.  Neck: Normal range of motion.  Cardiovascular: Normal rate and regular rhythm.   Pulmonary/Chest: Effort normal. No respiratory distress.  Musculoskeletal: Normal range of motion.  Full range of motion of the right thumb intact. No swelling of the hand, MCP or thumb. No indication there is a tendon injury  Neurological: He is alert. Coordination normal.  Sensation to light touch intact over the thumb  Skin: Skin is warm and dry. There is erythema.  1.5 cm linear laceration noted to the dorsal surface of the right hand. Laceration lies approximately 5 mm from the base of the first MCP joint. Laceration is superficial with a depth of approximately 1 mm. Wound edges approximate well. Bleeding controlled without cause at this time. Mild surrounding erythema extending approximately 4 cm from the wound. No streaking. No induration or fluctuance. No sign of abscess.  Psychiatric: He has a normal  mood and affect. His behavior is normal.  Nursing note and vitals reviewed.   ED Course  .Marland Kitchen.Laceration Repair Date/Time: 05/11/2016 3:50 PM Performed by: Alveta HeimlichBARRETT, Tymara Saur Authorized by: Alveta HeimlichBARRETT, Jedaiah Rathbun Consent: Verbal consent obtained. Risks and benefits: risks, benefits and alternatives were discussed Consent given by: patient Patient understanding: patient states understanding of the procedure being performed Patient consent: the patient's understanding of the procedure matches consent given Procedure consent: procedure consent matches procedure scheduled Imaging studies: imaging studies available Required items: required blood products, implants, devices, and special equipment available Patient identity confirmed: verbally with patient Body area: upper extremity Location details: right hand Laceration length: 1.5 cm Foreign bodies: no foreign bodies Tendon involvement: none Nerve involvement: none Vascular damage: no Patient sedated: no Irrigation solution: saline Amount of cleaning: standard Skin closure: Steri-Strips Approximation: close Approximation difficulty: simple Patient tolerance: Patient tolerated the procedure well with no immediate complications   (including critical care time) Labs Review Labs Reviewed - No data to display  Imaging Review Dg Finger Thumb Left  05/11/2016  CLINICAL DATA:  Dorsal laceration to the space between the thumb and index finger. EXAM: LEFT THUMB 2+V COMPARISON:  None. FINDINGS: No acute bone or soft tissue abnormality is present. No radiopaque foreign body is present. The thumb is within normal limits. Visualized portions of the second ray are intact. IMPRESSION: Negative radiographs of the thumb. Electronically Signed   By: Marin Robertshristopher  Mattern M.D.   On: 05/11/2016 15:33   I have personally reviewed and evaluated these images and lab results as part of my medical decision-making.   EKG Interpretation None      MDM   Final  diagnoses:  Cellulitis of right upper extremity   55 year old male presenting with a laceration to the dorsal surface of his right hand sustained last evening. Presents today due to surrounding erythema. No drainage. No superficial abscess. No streaking. Xray of the hand negative for fracture or foreign body. Laceration is superficial with wound edges approximated well and does not require suture repair. Laceration cleaned and no foreign bodies present. Repaired with 3 Steri-Strips. Pt requesting thumb splint to prevent "popping the cut open". Updated tetanus. Will discharge with keflex for possible early cellulitis. Pt encouraged to return if redness begins to streak, grows in area, fever or nausea/vomiting develop.  Pt is alert, oriented, NAD, afebrile, non tachycardic, nonseptic and nontoxic appearing. Pt to be d/c on oral antibiotics with strict f/u instructions.     Alveta HeimlichStevi Bayle Calvo, PA-C 05/11/16 1609  Glynn OctaveStephen Rancour, MD 05/11/16 [phone]

## 2016-05-11 NOTE — Discharge Instructions (Signed)
Cellulitis °Cellulitis is an infection of the skin and the tissue beneath it. The infected area is usually red and tender. Cellulitis occurs most often in the arms and lower legs.  °CAUSES  °Cellulitis is caused by bacteria that enter the skin through cracks or cuts in the skin. The most common types of bacteria that cause cellulitis are staphylococci and streptococci. °SIGNS AND SYMPTOMS  °· Redness and warmth. °· Swelling. °· Tenderness or pain. °· Fever. °DIAGNOSIS  °Your health care provider can usually determine what is wrong based on a physical exam. Blood tests may also be done. °TREATMENT  °Treatment usually involves taking an antibiotic medicine. °HOME CARE INSTRUCTIONS  °· Take your antibiotic medicine as directed by your health care provider. Finish the antibiotic even if you start to feel better. °· Keep the infected arm or leg elevated to reduce swelling. °· Apply a warm cloth to the affected area up to 4 times per day to relieve pain. °· Take medicines only as directed by your health care provider. °· Keep all follow-up visits as directed by your health care provider. °SEEK MEDICAL CARE IF:  °· You notice red streaks coming from the infected area. °· Your red area gets larger or turns dark in color. °· Your bone or joint underneath the infected area becomes painful after the skin has healed. °· Your infection returns in the same area or another area. °· You notice a swollen bump in the infected area. °· You develop new symptoms. °· You have a fever. °SEEK IMMEDIATE MEDICAL CARE IF:  °· You feel very sleepy. °· You develop vomiting or diarrhea. °· You have a general ill feeling (malaise) with muscle aches and pains. °  °This information is not intended to replace advice given to you by your health care provider. Make sure you discuss any questions you have with your health care provider. °  °Document Released: 07/23/2005 Document Revised: 07/04/2015 Document Reviewed: 12/29/2011 °Elsevier Interactive  Patient Education ©2016 [employer] ° °Laceration Care, Adult °A laceration is a cut that goes through all of the layers of the skin and into the tissue that is right under the skin. Some lacerations heal on their own. Others need to be closed with stitches (sutures), staples, skin adhesive strips, or skin glue. Proper laceration care minimizes the risk of infection and helps the laceration to heal better. °HOW TO CARE FOR YOUR LACERATION °If sutures or staples were used: °· Keep the wound clean and dry. °· If you were given a bandage (dressing), you should change it at least one time per day or as told by your health care provider. You should also change it if it becomes wet or dirty. °· Keep the wound completely dry for the first 24 hours or as told by your health care provider. After that time, you may shower or bathe. However, make sure that the wound is not soaked in water until after the sutures or staples have been removed. °· Clean the wound one time each day or as told by your health care provider: °¨ Wash the wound with soap and water. °¨ Rinse the wound with water to remove all soap. °¨ Pat the wound dry with a clean towel. Do not rub the wound. °· After cleaning the wound, apply a thin layer of antibiotic ointment as told by your health care provider. This will help to prevent infection and keep the dressing from sticking to the wound. °· Have the sutures or staples removed as told   by your health care provider. °If skin adhesive strips were used: °· Keep the wound clean and dry. °· If you were given a bandage (dressing), you should change it at least one time per day or as told by your health care provider. You should also change it if it becomes dirty or wet. °· Do not get the skin adhesive strips wet. You may shower or bathe, but be careful to keep the wound dry. °· If the wound gets wet, pat it dry with a clean towel. Do not rub the wound. °· Skin adhesive strips fall off on their own. You may trim  the strips as the wound heals. Do not remove skin adhesive strips that are still stuck to the wound. They will fall off in time. °If skin glue was used: °· Try to keep the wound dry, but you may briefly wet it in the shower or bath. Do not soak the wound in water, such as by swimming. °· After you have showered or bathed, gently pat the wound dry with a clean towel. Do not rub the wound. °· Do not do any activities that will make you sweat heavily until the skin glue has fallen off on its own. °· Do not apply liquid, cream, or ointment medicine to the wound while the skin glue is in place. Using those may loosen the film before the wound has healed. °· If you were given a bandage (dressing), you should change it at least one time per day or as told by your health care provider. You should also change it if it becomes dirty or wet. °· If a dressing is placed over the wound, be careful not to apply tape directly over the skin glue. Doing that may cause the glue to be pulled off before the wound has healed. °· Do not pick at the glue. The skin glue usually remains in place for 5-10 days, then it falls off of the skin. °General Instructions °· Take over-the-counter and prescription medicines only as told by your health care provider. °· If you were prescribed an antibiotic medicine or ointment, take or apply it as told by your doctor. Do not stop using it even if your condition improves. °· To help prevent scarring, make sure to cover your wound with sunscreen whenever you are outside after stitches are removed, after adhesive strips are removed, or when glue remains in place and the wound is healed. Make sure to wear a sunscreen of at least 30 SPF. °· Do not scratch or pick at the wound. °· Keep all follow-up visits as told by your health care provider. This is important. °· Check your wound every day for signs of infection. Watch for: °¨ Redness, swelling, or pain. °¨ Fluid, blood, or pus. °· Raise (elevate) the  injured area above the level of your heart while you are sitting or lying down, if possible. °SEEK MEDICAL CARE IF: °· You received a tetanus shot and you have swelling, severe pain, redness, or bleeding at the injection site. °· You have a fever. °· A wound that was closed breaks open. °· You notice a bad smell coming from your wound or your dressing. °· You notice something coming out of the wound, such as wood or glass. °· Your pain is not controlled with medicine. °· You have increased redness, swelling, or pain at the site of your wound. °· You have fluid, blood, or pus coming from your wound. °· You notice a change in the   color of your skin near your wound. °· You need to change the dressing frequently due to fluid, blood, or pus draining from the wound. °· You develop a new rash. °· You develop numbness around the wound. °SEEK IMMEDIATE MEDICAL CARE IF: °· You develop severe swelling around the wound. °· Your pain suddenly increases and is severe. °· You develop painful lumps near the wound or on skin that is anywhere on your body. °· You have a red streak going away from your wound. °· The wound is on your hand or foot and you cannot properly move a finger or toe. °· The wound is on your hand or foot and you notice that your fingers or toes look pale or bluish. °  °This information is not intended to replace advice given to you by your health care provider. Make sure you discuss any questions you have with your health care provider. °  °Document Released: 10/13/2005 Document Revised: 02/27/2015 Document Reviewed: 10/09/2014 °Elsevier Interactive Patient Education ©2016 [employer] ° °

## 2016-08-31 ENCOUNTER — Encounter (HOSPITAL_BASED_OUTPATIENT_CLINIC_OR_DEPARTMENT_OTHER): Payer: Self-pay | Admitting: *Deleted

## 2016-08-31 ENCOUNTER — Emergency Department (HOSPITAL_BASED_OUTPATIENT_CLINIC_OR_DEPARTMENT_OTHER): Payer: 59

## 2016-08-31 ENCOUNTER — Emergency Department (HOSPITAL_BASED_OUTPATIENT_CLINIC_OR_DEPARTMENT_OTHER)
Admission: EM | Admit: 2016-08-31 | Discharge: 2016-08-31 | Disposition: A | Discharge status: Home / Self Care | Payer: 59 | Attending: Emergency Medicine | Admitting: Emergency Medicine

## 2016-08-31 DIAGNOSIS — Z79899 Other long term (current) drug therapy: Secondary | ICD-10-CM | POA: Diagnosis not present

## 2016-08-31 DIAGNOSIS — Z7951 Long term (current) use of inhaled steroids: Secondary | ICD-10-CM | POA: Diagnosis not present

## 2016-08-31 DIAGNOSIS — J45901 Unspecified asthma with (acute) exacerbation: Secondary | ICD-10-CM | POA: Diagnosis not present

## 2016-08-31 DIAGNOSIS — F17290 Nicotine dependence, other tobacco product, uncomplicated: Secondary | ICD-10-CM | POA: Diagnosis not present

## 2016-08-31 DIAGNOSIS — R0602 Shortness of breath: Secondary | ICD-10-CM | POA: Diagnosis present

## 2016-08-31 MED ORDER — ALBUTEROL SULFATE (2.5 MG/3ML) 0.083% IN NEBU
2.5000 mg | INHALATION_SOLUTION | Freq: Once | RESPIRATORY_TRACT | Status: AC
  Administered 2016-08-31: 2.5 mg via RESPIRATORY_TRACT
  Filled 2016-08-31: qty 3

## 2016-08-31 MED ORDER — IPRATROPIUM-ALBUTEROL 0.5-2.5 (3) MG/3ML IN SOLN
3.0000 mL | Freq: Once | RESPIRATORY_TRACT | Status: AC
  Administered 2016-08-31: 3 mL via RESPIRATORY_TRACT
  Filled 2016-08-31: qty 3

## 2016-08-31 MED ORDER — PREDNISONE 10 MG PO TABS: 40.0000 mg | ORAL_TABLET | Freq: Every day | ORAL | 0 refills | Status: DC

## 2016-08-31 MED ORDER — PREDNISONE 20 MG PO TABS
40.0000 mg | ORAL_TABLET | Freq: Once | ORAL | Status: AC
  Administered 2016-08-31: 40 mg via ORAL
  Filled 2016-08-31: qty 2

## 2016-08-31 NOTE — ED Provider Notes (Signed)
[facility] Provider Note   CSN: [id number] Arrival date & time: 08/31/16  [id number]     History   Chief Complaint Chief Complaint  Patient presents with  . Asthma    HPI Katrina StackDarren Guderian is a 55 y.o. male.  The history is provided by the patient. No language interpreter was used.  Asthma    Abdurahman Orland PenmanMerrills is a 55 y.o. male who presents to the Emergency Department complaining of SOB.  He has a history of asthma and reports increased shortness of breath with chest pain over the last 2-3 days. He has been using his albuterol nebulizer twice a day and how to use his rescue inhaler last night. He reports transient mild improvement after using his treatments at home. He has associated chest tightness. No diaphoresis, fever, cough, abdominal pain, leg swelling or pain. He has been out of his Advair for the last few weeks. He usually has to asthma flares per year and this is similar to his prior flares. Past Medical History:  Diagnosis Date  . Asthma   . Thyroid disease     There are no active problems to display for this patient.   Past Surgical History:  Procedure Laterality Date  . NOSE SURGERY         Home Medications    Prior to Admission medications   Medication Sig Start Date End Date Taking? Authorizing Provider  albuterol (PROVENTIL HFA;VENTOLIN HFA) 108 (90 BASE) MCG/ACT inhaler Inhale into the lungs every 6 (six) hours as needed for wheezing or shortness of breath.   Yes Historical Provider, MD  Ascorbic Acid (VITA-C PO) Take by mouth daily.   Yes Historical Provider, MD  betamethasone dipropionate 0.05 % lotion Apply topically as needed.   Yes Historical Provider, MD  COD LIVER OIL PO Take by mouth daily.   Yes Historical Provider, MD  levothyroxine (SYNTHROID, LEVOTHROID) 88 MCG tablet Take 88 mcg by mouth daily before breakfast.   Yes Historical Provider, MD  Misc Natural Products (PROSTATE) CAPS Take 2 capsules by mouth daily.   Yes Historical  Provider, MD  Multiple Vitamin (MULTIVITAMIN) tablet Take 1 tablet by mouth daily.   Yes Historical Provider, MD  cephALEXin (KEFLEX) 500 MG capsule Take 1 capsule (500 mg total) by mouth 2 (two) times daily. 05/11/16   Stevi Barrett, PA-C  ibuprofen (ADVIL,MOTRIN) 800 MG tablet Take 1 tablet (800 mg total) by mouth 3 (three) times daily. 05/11/16   Stevi Barrett, PA-C  mometasone (ELOCON) 0.1 % cream Apply 1 application topically daily.    Historical Provider, MD  predniSONE (DELTASONE) 10 MG tablet Take 4 tablets (40 mg total) by mouth daily. 08/31/16   Tilden FossaElizabeth Yasmen Cortner, MD    Family History No family history on file.  Social History Social History  Substance Use Topics  . Smoking status: Current Some Day Smoker    Types: Cigars  . Smokeless tobacco: Never Used  . Alcohol use Yes     Comment: every few weeks     Allergies   Patient has no known allergies.   Review of Systems Review of Systems  All other systems reviewed and are negative.    Physical Exam Updated Vital Signs BP 137/95 (BP Location: Right Arm)   Pulse 70   Temp 97.9 F (36.6 C) (Oral)   Resp 14   Ht 5\' 10"  (1.778 m)   Wt 172 lb (78 kg)   SpO2 100%   BMI 24.68 kg/m   Physical Exam  Constitutional: He is oriented to person, place, and time. He appears well-developed and well-nourished.  HENT:  Head: Normocephalic and atraumatic.  Cardiovascular: Normal rate and regular rhythm.   No murmur heard. Pulmonary/Chest: Effort normal. No respiratory distress.  Decreased air movement in bilateral bases  Abdominal: Soft. There is no tenderness. There is no rebound and no guarding.  Musculoskeletal: He exhibits no edema or tenderness.  Neurological: He is alert and oriented to person, place, and time.  Skin: Skin is warm and dry.  Psychiatric: He has a normal mood and affect. His behavior is normal.  Nursing note and vitals reviewed.    ED Treatments / Results  Labs (all labs ordered are listed, but only  abnormal results are displayed) Labs Reviewed - No data to display  EKG  EKG Interpretation None       Radiology Dg Chest 2 View  Result Date: 08/31/2016 CLINICAL DATA:  Shortness of breath, history of asthma and smoking EXAM: CHEST  2 VIEW COMPARISON:  12/18/2015 FINDINGS: Borderline enlargement of cardiac silhouette. Mediastinal contours and pulmonary vascularity normal. Minimal bibasilar atelectasis. Lungs otherwise clear. No pleural effusion or pneumothorax. Bones unremarkable. IMPRESSION: Normal bibasilar atelectasis. Electronically Signed   By: Ulyses SouthwardMark  Boles M.D.   On: 08/31/2016 08:44    Procedures Procedures (including critical care time)  Medications Ordered in ED Medications  ipratropium-albuterol (DUONEB) 0.5-2.5 (3) MG/3ML nebulizer solution 3 mL (3 mLs Nebulization Given 08/31/16 0755)  albuterol (PROVENTIL) (2.5 MG/3ML) 0.083% nebulizer solution 2.5 mg (2.5 mg Nebulization Given 08/31/16 0755)  predniSONE (DELTASONE) tablet 40 mg (40 mg Oral Given 08/31/16 0825)  ipratropium-albuterol (DUONEB) 0.5-2.5 (3) MG/3ML nebulizer solution 3 mL (3 mLs Nebulization Given 08/31/16 0901)     Initial Impression / Assessment and Plan / ED Course  I have reviewed the triage vital signs and the nursing notes.  Pertinent labs & imaging results that were available during my care of the patient were reviewed by me and considered in my medical decision making (see chart for details).  Clinical Course     Patient with history of asthma here for increased shortness of breath. He is in no distress on examination with decreased air movement in the bases. On repeat evaluation he feels improved. No hypoxia or respiratory distress. Presentation is not consistent with pneumonia, CHF, PE. Discussed home care for asthma exacerbation outpatient follow-up and return precautions.  Final Clinical Impressions(s) / ED Diagnoses   Final diagnoses:  Exacerbation of asthma, unspecified asthma severity,  unspecified whether persistent    New Prescriptions New Prescriptions   PREDNISONE (DELTASONE) 10 MG TABLET    Take 4 tablets (40 mg total) by mouth daily.     Tilden FossaElizabeth Myracle Febres, MD 08/31/16 0930

## 2016-08-31 NOTE — ED Triage Notes (Signed)
Pt reports hx of asthma and states he regularly uses his nebulizer twice daily; states this is second flare-up within 6 months. Pt speaking in complete sentences; breathing regular, even, unlabored. Denies fever, n/v/d. Reports associated chest tightness.

## 2016-09-26 ENCOUNTER — Ambulatory Visit (INDEPENDENT_AMBULATORY_CARE_PROVIDER_SITE_OTHER): Payer: 59 | Admitting: Pulmonary Disease

## 2016-09-26 ENCOUNTER — Encounter: Payer: Self-pay | Admitting: Pulmonary Disease

## 2016-09-26 VITALS — BP 122/80 | HR 84 | Ht 70.0 in | Wt 170.5 lb

## 2016-09-26 DIAGNOSIS — R0602 Shortness of breath: Secondary | ICD-10-CM

## 2016-09-26 LAB — NITRIC OXIDE: NITRIC OXIDE: 66

## 2016-09-26 NOTE — Progress Notes (Signed)
Sean Baker    [id number]    May 22, 1961  Primary Care Physician:KALISH, Nolon BussingMICHAEL J, MD  Referring Physician: Loyal JacobsonMichael Kalish, MD [address] [place], [place] [address]  Chief complaint:  Consult for evaluation of dyspnea.  HPI: Mr. Sean PenmanMerrills is a 55 year old with past medical history of asthma. He has complains of increasing dyspnea, chest congestion, tightness for the past several months. He has been on prednisone tapers in the past.  He is seen by his primary care physician who told him he has COPD and he is here for a second opinion. He has been put on Advair and Spiriva which he is not taking regularly. He is on a rescue inhaler which he uses up to 4 times a week. He used to smoke about 1 cigarette a day. He quit in 2016  Outpatient Encounter Prescriptions as of 09/26/2016  Medication Sig  . albuterol (PROVENTIL HFA;VENTOLIN HFA) 108 (90 BASE) MCG/ACT inhaler Inhale into the lungs every 6 (six) hours as needed for wheezing or shortness of breath.  . amphetamine-dextroamphetamine (ADDERALL) 10 MG tablet Take 10 mg by mouth daily with breakfast.  . Ascorbic Acid (VITA-C PO) Take by mouth daily.  . betamethasone dipropionate 0.05 % lotion Apply topically as needed.  . COD LIVER OIL PO Take by mouth daily.  Marland Kitchen. escitalopram (LEXAPRO) 10 MG tablet Take 10 mg by mouth daily.  . fluticasone-salmeterol (ADVAIR HFA) 115-21 MCG/ACT inhaler Inhale 2 puffs into the lungs 2 (two) times daily.  Marland Kitchen. levothyroxine (SYNTHROID, LEVOTHROID) 88 MCG tablet Take 88 mcg by mouth daily before breakfast.  . Misc Natural Products (PROSTATE) CAPS Take 2 capsules by mouth daily.  . montelukast (SINGULAIR) 10 MG tablet Take 10 mg by mouth at bedtime.  . Multiple Vitamin (MULTIVITAMIN) tablet Take 1 tablet by mouth daily.  . pantoprazole (PROTONIX) 20 MG tablet Take 20 mg by mouth daily.  Marland Kitchen. tiotropium (SPIRIVA HANDIHALER) 18 MCG inhalation capsule Place 18 mcg into inhaler and inhale  daily.  . [DISCONTINUED] cephALEXin (KEFLEX) 500 MG capsule Take 1 capsule (500 mg total) by mouth 2 (two) times daily.  . [DISCONTINUED] ibuprofen (ADVIL,MOTRIN) 800 MG tablet Take 1 tablet (800 mg total) by mouth 3 (three) times daily.  . [DISCONTINUED] mometasone (ELOCON) 0.1 % cream Apply 1 application topically daily.  . [DISCONTINUED] predniSONE (DELTASONE) 10 MG tablet Take 4 tablets (40 mg total) by mouth daily.   No facility-administered encounter medications on file as of 09/26/2016.     Allergies as of 09/26/2016  . (No Known Allergies)    Past Medical History:  Diagnosis Date  . Asthma   . Thyroid disease     Past Surgical History:  Procedure Laterality Date  . FOOT SURGERY  1994  . NOSE SURGERY  1981    Family History  Problem Relation Age of Onset  . Heart attack Father     Occurred when 89yrs old. Minor    Social History   Social History  . Marital status: Married    Spouse name: N/A  . Number of children: N/A  . Years of education: N/A   Occupational History  . Not on file.   Social History Main Topics  . Smoking status: Former Smoker    Years: 25.00    Types: Cigars    Quit date: 09/27/2015  . Smokeless tobacco: Never Used  . Alcohol use Yes     Comment: every few weeks  . Drug use:  No  . Sexual activity: Not on file   Other Topics Concern  . Not on file   Social History Narrative   Married lives with spouse. Has 5 children. Works as Retail bankerservice technician.    Review of systems: Review of Systems  Constitutional: Negative for fever and chills.  HENT: Negative.   Eyes: Negative for blurred vision.  Respiratory: as per HPI  Cardiovascular: Negative for chest pain and palpitations.  Gastrointestinal: Negative for vomiting, diarrhea, blood per rectum. Genitourinary: Negative for dysuria, urgency, frequency and hematuria.  Musculoskeletal: Negative for myalgias, back pain and joint pain.  Skin: Negative for itching and rash.  Neurological:  Negative for dizziness, tremors, focal weakness, seizures and loss of consciousness.  Endo/Heme/Allergies: Negative for environmental allergies.  Psychiatric/Behavioral: Negative for depression, suicidal ideas and hallucinations.  All other systems reviewed and are negative.   Physical Exam: Blood pressure 122/80, pulse 84, height 5\' 10"  (1.778 m), weight 170 lb 8 oz (77.3 kg), SpO2 98 %. Gen:      No acute distress HEENT:  EOMI, sclera anicteric Neck:     No masses; no thyromegaly Lungs:    Clear to auscultation bilaterally; normal respiratory effort CV:         Regular rate and rhythm; no murmurs Abd:      + bowel sounds; soft, non-tender; no palpable masses, no distension Ext:    No edema; adequate peripheral perfusion Skin:      Warm and dry; no rash Neuro: alert and oriented x 3 Psych: normal mood and affect  Data Reviewed: FENO 09/26/16- 66   CT chest 6/11/1 5-5 millimeter left lower lobe calcified granuloma Chest x-ray 08/31/16-bibasal atelectasis. All images reviewed.  Assessment:  Symptoms of dyspnea are likely secondary to asthma. He has a high FENO suggesting eosinophilic inflammation. He may have underlying COPD because of cigar smoking but I don't think this is the major cause of his dyspnea.  We have discussed the role of controller medication and need to be on some kind of inhaler on a regular basis. I'll start him on by mouth. He'll continue using the albuterol when necessary. He'll also be scheduled for pulmonary function tests for better assessment of his lung function.  Plan/Recommendations: - Stop the Advair, Spiriva - Start Breo 100, continue albuterol PRN - Check PFTs  Chilton GreathousePraveen Onya Eutsler MD [facility] Pager [phone] 09/26/2016, 4:21 PM  CC: Loyal JacobsonKalish, Michael, MD

## 2016-09-26 NOTE — Patient Instructions (Addendum)
We started you on  Breo 100.  Use albuterol PRN Stop using the spiriva and advair We will schedule you for PFTs  Return in 1-2 months

## 2016-09-30 MED ORDER — FLUTICASONE FUROATE-VILANTEROL 100-25 MCG/INH IN AEPB: 1.0000 | INHALATION_SPRAY | Freq: Every day | RESPIRATORY_TRACT | 2 refills | Status: DC

## 2016-09-30 MED ORDER — FLUTICASONE FUROATE-VILANTEROL 100-25 MCG/INH IN AEPB: 1.0000 | INHALATION_SPRAY | Freq: Every day | RESPIRATORY_TRACT | 0 refills | Status: DC

## 2016-10-02 ENCOUNTER — Ambulatory Visit (HOSPITAL_COMMUNITY)
Admission: RE | Admit: 2016-10-02 | Discharge: 2016-10-02 | Disposition: A | Discharge status: Home / Self Care | Payer: 59 | Source: Ambulatory Visit | Attending: Pulmonary Disease | Admitting: Pulmonary Disease

## 2016-10-02 DIAGNOSIS — J449 Chronic obstructive pulmonary disease, unspecified: Secondary | ICD-10-CM | POA: Insufficient documentation

## 2016-10-02 DIAGNOSIS — R942 Abnormal results of pulmonary function studies: Secondary | ICD-10-CM | POA: Insufficient documentation

## 2016-10-02 DIAGNOSIS — Z87891 Personal history of nicotine dependence: Secondary | ICD-10-CM | POA: Insufficient documentation

## 2016-10-02 DIAGNOSIS — R0602 Shortness of breath: Secondary | ICD-10-CM

## 2016-10-02 LAB — PULMONARY FUNCTION TEST
DL/VA % PRED: 114 %
DL/VA: 5.29 ml/min/mmHg/L
DLCO unc % pred: 75 %
DLCO unc: 24.27 ml/min/mmHg
FEF 25-75 POST: 1.17 L/s
FEF 25-75 Pre: 1.53 L/sec
FEF2575-%Change-Post: -23 %
FEF2575-%Pred-Post: 38 %
FEF2575-%Pred-Pre: 49 %
FEV1-%CHANGE-POST: -5 %
FEV1-%PRED-PRE: 72 %
FEV1-%Pred-Post: 68 %
FEV1-POST: 2.21 L
FEV1-PRE: 2.35 L
FEV1FVC-%Change-Post: -5 %
FEV1FVC-%PRED-PRE: 88 %
FEV6-%Change-Post: 0 %
FEV6-%PRED-POST: 83 %
FEV6-%PRED-PRE: 83 %
FEV6-POST: 3.28 L
FEV6-PRE: 3.3 L
FEV6FVC-%CHANGE-POST: 1 %
FEV6FVC-%PRED-POST: 102 %
FEV6FVC-%PRED-PRE: 101 %
FVC-%Change-Post: 0 %
FVC-%PRED-PRE: 82 %
FVC-%Pred-Post: 82 %
FVC-POST: 3.36 L
FVC-PRE: 3.36 L
POST FEV6/FVC RATIO: 100 %
PRE FEV6/FVC RATIO: 98 %
Post FEV1/FVC ratio: 66 %
Pre FEV1/FVC ratio: 70 %
RV % PRED: 92 %
RV: 2.01 L
TLC % PRED: 79 %
TLC: 5.55 L

## 2016-10-02 MED ORDER — ALBUTEROL SULFATE (2.5 MG/3ML) 0.083% IN NEBU
2.5000 mg | INHALATION_SOLUTION | Freq: Once | RESPIRATORY_TRACT | Status: AC
  Administered 2016-10-02: 2.5 mg via RESPIRATORY_TRACT

## 2016-11-07 NOTE — Progress Notes (Signed)
Called spoke with patient, advised of PFT results / recs as stated by PM.  Pt verbalized his understanding and denied any questions.

## 2016-11-28 ENCOUNTER — Encounter: Payer: Self-pay | Admitting: Pulmonary Disease

## 2016-11-28 ENCOUNTER — Ambulatory Visit (INDEPENDENT_AMBULATORY_CARE_PROVIDER_SITE_OTHER): Payer: 59 | Admitting: Pulmonary Disease

## 2016-11-28 VITALS — BP 130/80 | HR 60 | Ht 70.0 in | Wt 170.0 lb

## 2016-11-28 DIAGNOSIS — J4520 Mild intermittent asthma, uncomplicated: Secondary | ICD-10-CM | POA: Diagnosis not present

## 2016-11-28 NOTE — Progress Notes (Signed)
Yale Golla    [id number]    1960/11/18  Primary Care Physician:KALISH, Nolon Bussing, MD  Referring Physician: Loyal Jacobson, MD [address] [place], [place] [address]  Chief complaint:  Follow up for mild persistent asthma  HPI: Mr. Range is a 56 year old with past medical history of asthma. He has complains of increasing dyspnea, chest congestion, tightness for the past several months. He has been on prednisone tapers in the past.  He is seen by his primary care physician who told him he has COPD and he is here for a second opinion. He has been put on Advair and Spiriva which he is not taking regularly. He is on a rescue inhaler which he uses up to 4 times a week. He used to smoke about 1 cigarette a day. He quit in 2016  Interim history: He started on Breo at last visit. He feels that this has improved his symptoms a lot. He still using the albuterol rescue inhaler twice not because he needs it but because he is unaware that this is a rescue inhaler.  Outpatient Encounter Prescriptions as of 11/28/2016  Medication Sig  . albuterol (PROVENTIL HFA;VENTOLIN HFA) 108 (90 BASE) MCG/ACT inhaler Inhale into the lungs every 6 (six) hours as needed for wheezing or shortness of breath.  . amphetamine-dextroamphetamine (ADDERALL) 10 MG tablet Take 10 mg by mouth daily with breakfast.  . Ascorbic Acid (VITA-C PO) Take by mouth daily.  . betamethasone dipropionate 0.05 % lotion Apply topically as needed.  . COD LIVER OIL PO Take by mouth daily.  Marland Kitchen escitalopram (LEXAPRO) 20 MG tablet Take 20 mg by mouth daily.  . fluticasone furoate-vilanterol (BREO ELLIPTA) 100-25 MCG/INH AEPB Inhale 1 puff into the lungs daily.  . fluticasone furoate-vilanterol (BREO ELLIPTA) 100-25 MCG/INH AEPB Inhale 1 puff into the lungs daily.  Marland Kitchen levothyroxine (SYNTHROID, LEVOTHROID) 88 MCG tablet Take 88 mcg by mouth daily before breakfast.  . Misc Natural Products (PROSTATE) CAPS Take 2  capsules by mouth daily.  . montelukast (SINGULAIR) 10 MG tablet Take 10 mg by mouth at bedtime.  . Multiple Vitamin (MULTIVITAMIN) tablet Take 1 tablet by mouth daily.  . pantoprazole (PROTONIX) 20 MG tablet Take 20 mg by mouth daily.  . [DISCONTINUED] escitalopram (LEXAPRO) 10 MG tablet Take 20 mg by mouth daily.   . [DISCONTINUED] fluticasone-salmeterol (ADVAIR HFA) 115-21 MCG/ACT inhaler Inhale 2 puffs into the lungs 2 (two) times daily.  . [DISCONTINUED] tiotropium (SPIRIVA HANDIHALER) 18 MCG inhalation capsule Place 18 mcg into inhaler and inhale daily.   No facility-administered encounter medications on file as of 11/28/2016.     Allergies as of 11/28/2016  . (No Known Allergies)    Past Medical History:  Diagnosis Date  . Asthma   . Thyroid disease     Past Surgical History:  Procedure Laterality Date  . FOOT SURGERY  1994  . NOSE SURGERY  1981    Family History  Problem Relation Age of Onset  . Heart attack Father     Occurred when 71yrs old. Minor    Social History   Social History  . Marital status: Married    Spouse name: N/A  . Number of children: N/A  . Years of education: N/A   Occupational History  . Not on file.   Social History Main Topics  . Smoking status: Former Smoker    Years: 25.00    Types: Cigars    Quit date:  09/27/2015  . Smokeless tobacco: Never Used  . Alcohol use Yes     Comment: every few weeks  . Drug use: No  . Sexual activity: Not on file   Other Topics Concern  . Not on file   Social History Narrative   Married lives with spouse. Has 5 children. Works as Retail bankerservice technician.    Review of systems: Review of Systems  Constitutional: Negative for fever and chills.  HENT: Negative.   Eyes: Negative for blurred vision.  Respiratory: as per HPI  Cardiovascular: Negative for chest pain and palpitations.  Gastrointestinal: Negative for vomiting, diarrhea, blood per rectum. Genitourinary: Negative for dysuria, urgency,  frequency and hematuria.  Musculoskeletal: Negative for myalgias, back pain and joint pain.  Skin: Negative for itching and rash.  Neurological: Negative for dizziness, tremors, focal weakness, seizures and loss of consciousness.  Endo/Heme/Allergies: Negative for environmental allergies.  Psychiatric/Behavioral: Negative for depression, suicidal ideas and hallucinations.  All other systems reviewed and are negative.   Physical Exam: Blood pressure 130/80, pulse 60, height 5\' 10"  (1.778 m), weight 170 lb (77.1 kg), SpO2 98 %. Gen:      No acute distress HEENT:  EOMI, sclera anicteric Neck:     No masses; no thyromegaly Lungs:    Clear to auscultation bilaterally; normal respiratory effort CV:         Regular rate and rhythm; no murmurs Abd:      + bowel sounds; soft, non-tender; no palpable masses, no distension Ext:    No edema; adequate peripheral perfusion Skin:      Warm and dry; no rash Neuro: alert and oriented x 3 Psych: normal mood and affect  Data Reviewed: FENO 09/26/16- 66  FENO 11/28/16- 21  CT chest 6/11/1 5-5 millimeter left lower lobe calcified granuloma Chest x-ray 08/31/16-bibasal atelectasis. All images reviewed.  PFTs 10/02/16 FVC 3.36 [82%] FEV1 2.21 [68%] F/F 66 TLC 79% DLCO 75% Mild obstructive disease with minimal restriction and diffusion defect.  Assessment:  Mild persistent asthma His symptoms appear to be better controlled after the breo was started. His FENa is markedly improved compared to his last visit  He appears to be confused about how to use his rescue inhaler and is using albuterol every day even though he does not need it. I have instructed him on the proper use of his rescue inhaler. He'll start using it only as needed. We will continue to monitor his symptoms. If he continues to be well controlled at his next visit we may consider changing him to just a steroid inhaler.  Plan/Recommendations: - Continue breo - Use the albuterol as rescue  inhaler  Return in 6 months.  Chilton GreathousePraveen Michal Callicott MD [facility] Pager [phone] 11/28/2016, 4:52 PM  CC: Loyal JacobsonKalish, Michael, MD

## 2016-11-28 NOTE — Patient Instructions (Signed)
Continue using Breo. Remembered to use the albuterol inhaler only as needed on the days when you are more dyspneic and wheezy  Return to clinic in 6 months

## 2016-12-03 ENCOUNTER — Other Ambulatory Visit: Payer: Self-pay | Admitting: Family Medicine

## 2016-12-03 ENCOUNTER — Ambulatory Visit
Admission: RE | Admit: 2016-12-03 | Discharge: 2016-12-03 | Disposition: A | Discharge status: Home / Self Care | Payer: 59 | Source: Ambulatory Visit | Attending: Family Medicine | Admitting: Family Medicine

## 2016-12-03 DIAGNOSIS — M25551 Pain in right hip: Secondary | ICD-10-CM

## 2017-01-29 ENCOUNTER — Other Ambulatory Visit: Payer: Self-pay | Admitting: Pulmonary Disease

## 2017-01-29 MED ORDER — FLUTICASONE FUROATE-VILANTEROL 100-25 MCG/INH IN AEPB: 1.0000 | INHALATION_SPRAY | Freq: Every day | RESPIRATORY_TRACT | 2 refills | Status: DC

## 2017-04-23 ENCOUNTER — Telehealth: Payer: Self-pay | Admitting: Pulmonary Disease

## 2017-04-23 MED ORDER — FLUTICASONE FUROATE-VILANTEROL 100-25 MCG/INH IN AEPB: 1.0000 | INHALATION_SPRAY | Freq: Every day | RESPIRATORY_TRACT | 5 refills | Status: AC

## 2017-04-23 NOTE — Telephone Encounter (Signed)
Last ov 2.2.18 w/ PM: Patient Instructions  Continue using Breo. Remembered to use the albuterol inhaler only as needed on the days when you are more dyspneic and wheezy   Return to clinic in 6 months   [facility] [address] [facility] requested medication as been sent to the pharmacy he left in the message Pt is also active on MyChart so will send message to that account to ensure pt gets the message Will sign off

## 2017-05-28 ENCOUNTER — Ambulatory Visit: Payer: 59 | Admitting: Pulmonary Disease

## 2017-12-04 IMAGING — CR DG CHEST 2V
2 series · 2 of 2 positions shown · non-contrast
Comparison: 12/18/2015

CLINICAL DATA: Shortness of breath, history of asthma and smoking

EXAM:
CHEST  2 VIEW

[w chest pa]
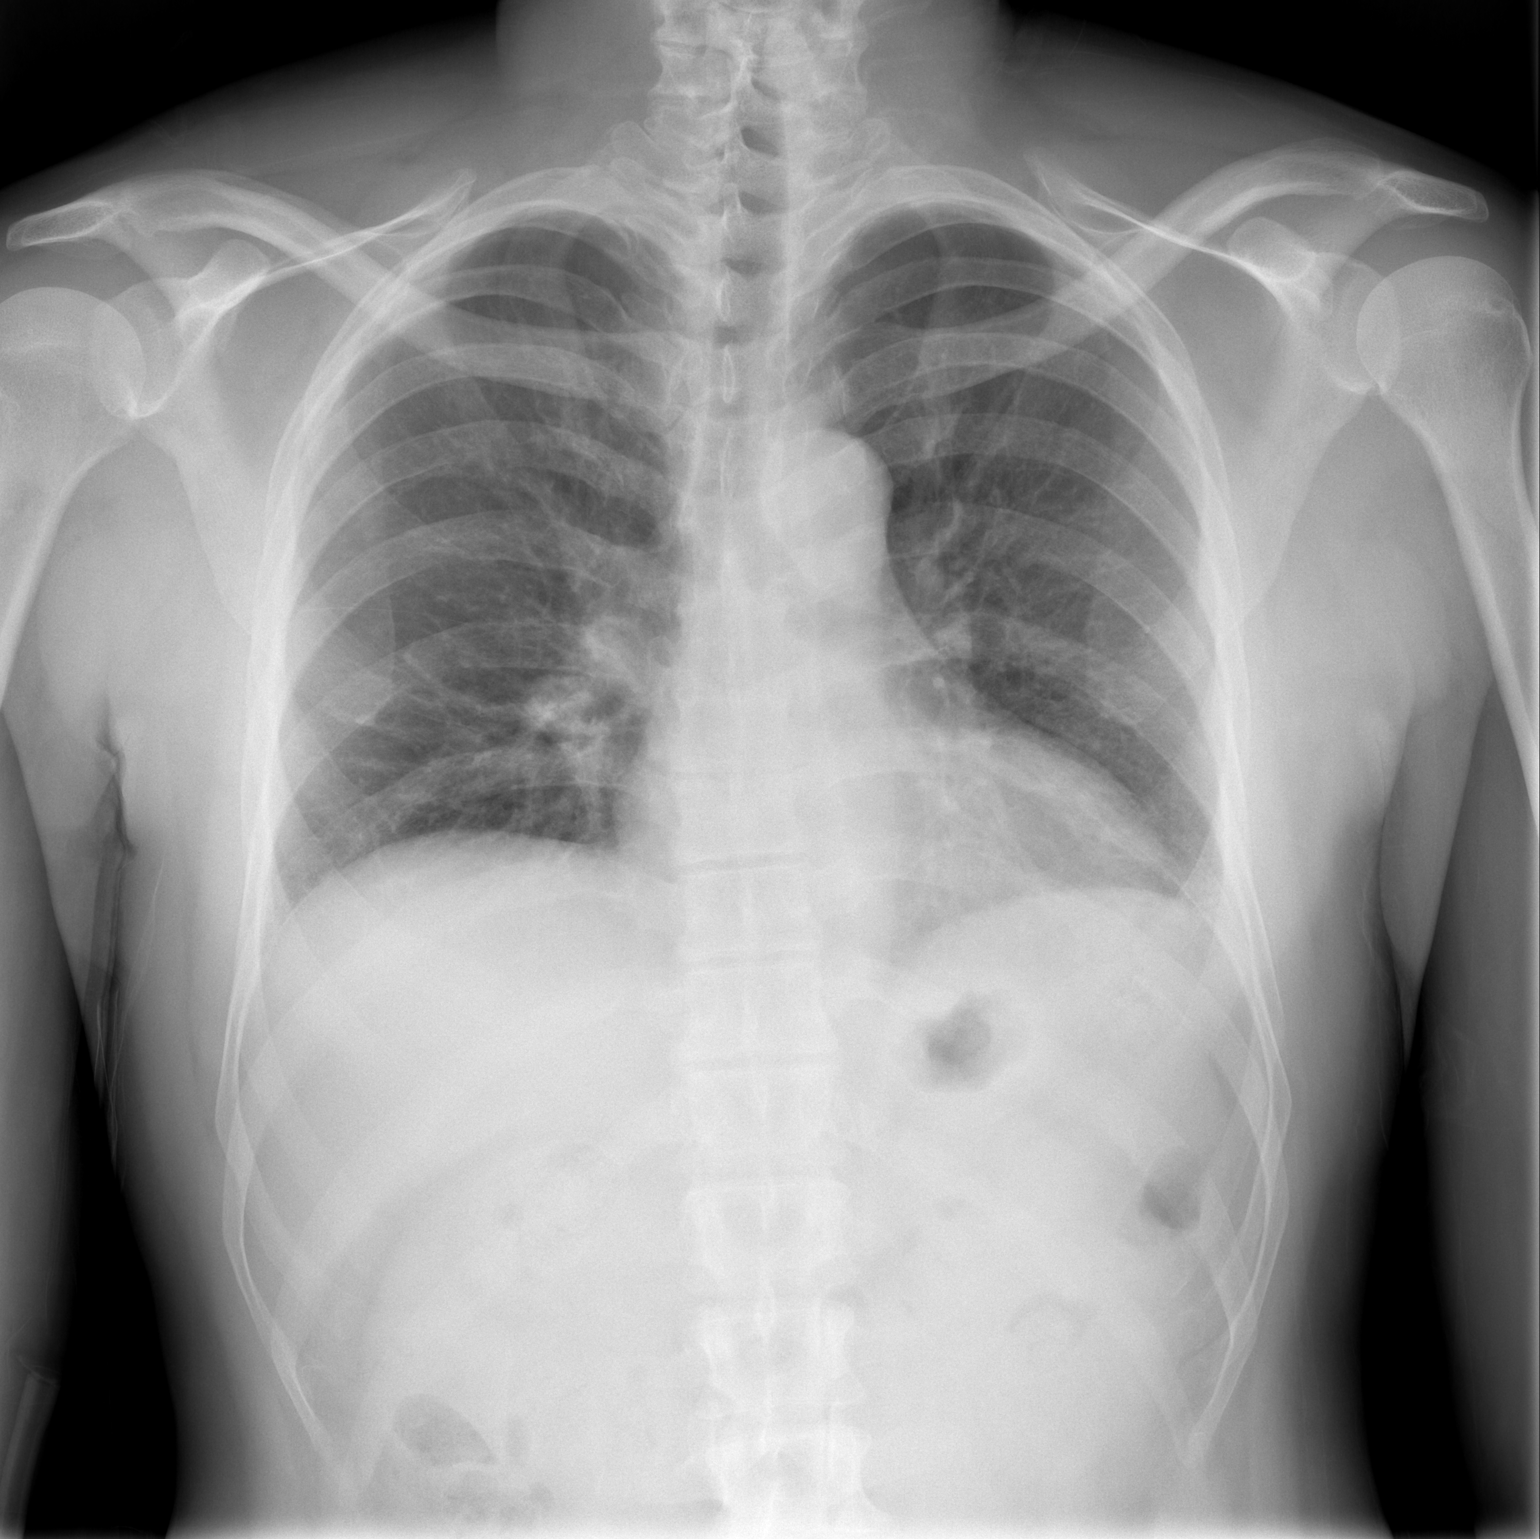

[w chest lat]
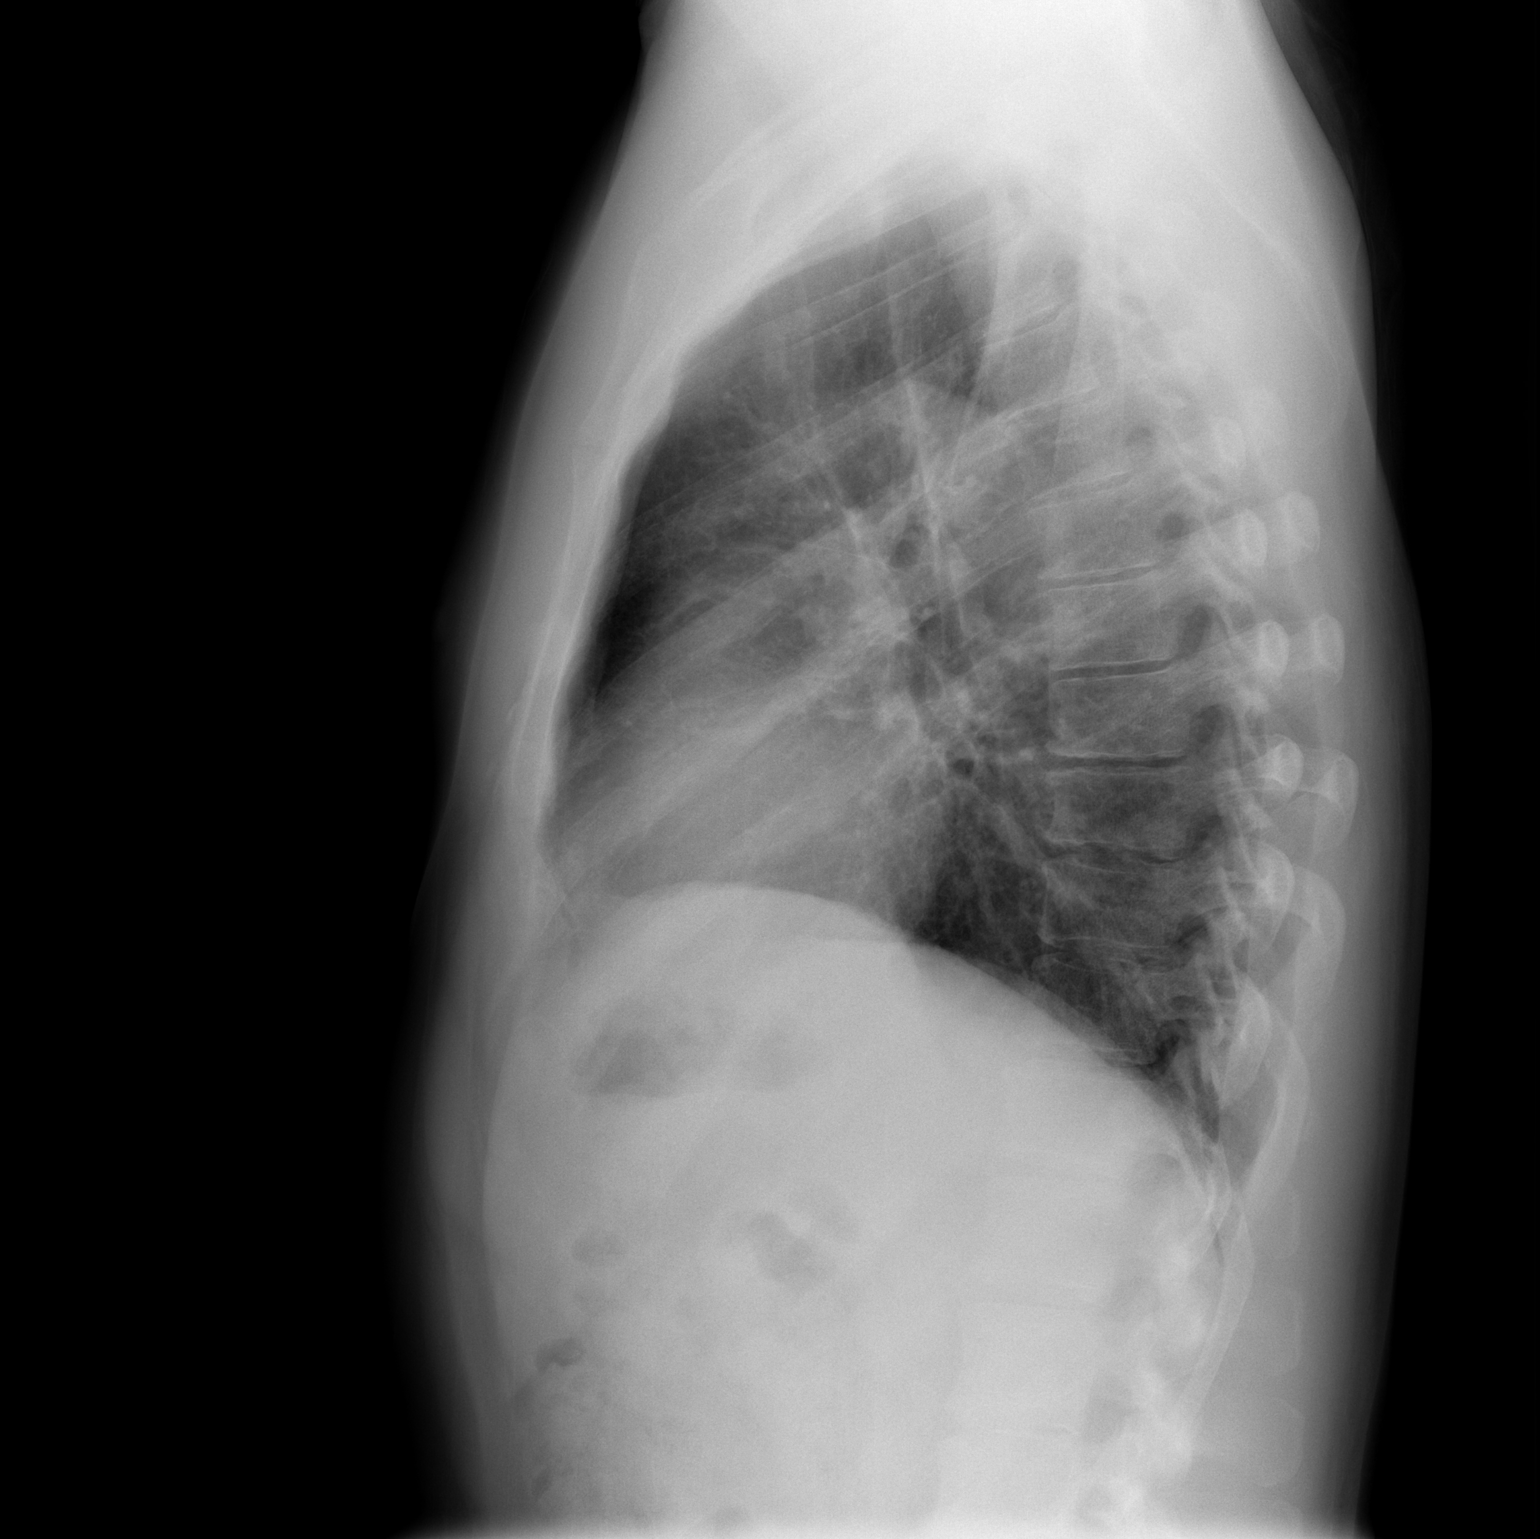

[2 of 2 positions shown; findings below may reference images not displayed]

FINDINGS: Borderline enlargement of cardiac silhouette.

Mediastinal contours and pulmonary vascularity normal.

Minimal bibasilar atelectasis.

Lungs otherwise clear.

No pleural effusion or pneumothorax.

Bones unremarkable.
IMPRESSION: Normal bibasilar atelectasis.

## 2018-03-05 DIAGNOSIS — F900 Attention-deficit hyperactivity disorder, predominantly inattentive type: Secondary | ICD-10-CM | POA: Diagnosis not present

## 2018-04-02 DIAGNOSIS — E7800 Pure hypercholesterolemia, unspecified: Secondary | ICD-10-CM | POA: Diagnosis not present

## 2018-04-02 DIAGNOSIS — S0003XD Contusion of scalp, subsequent encounter: Secondary | ICD-10-CM | POA: Diagnosis not present

## 2018-04-02 DIAGNOSIS — N528 Other male erectile dysfunction: Secondary | ICD-10-CM | POA: Diagnosis not present

## 2018-04-02 DIAGNOSIS — S060X0D Concussion without loss of consciousness, subsequent encounter: Secondary | ICD-10-CM | POA: Diagnosis not present

## 2018-04-02 DIAGNOSIS — E039 Hypothyroidism, unspecified: Secondary | ICD-10-CM | POA: Diagnosis not present

## 2018-04-02 DIAGNOSIS — Z125 Encounter for screening for malignant neoplasm of prostate: Secondary | ICD-10-CM | POA: Diagnosis not present

## 2018-06-11 DIAGNOSIS — M25775 Osteophyte, left foot: Secondary | ICD-10-CM | POA: Diagnosis not present

## 2018-06-11 DIAGNOSIS — M659 Synovitis and tenosynovitis, unspecified: Secondary | ICD-10-CM | POA: Diagnosis not present

## 2018-06-18 DIAGNOSIS — M25775 Osteophyte, left foot: Secondary | ICD-10-CM | POA: Diagnosis not present

## 2018-06-22 DIAGNOSIS — Z021 Encounter for pre-employment examination: Secondary | ICD-10-CM | POA: Diagnosis not present

## 2018-06-22 DIAGNOSIS — J4520 Mild intermittent asthma, uncomplicated: Secondary | ICD-10-CM | POA: Diagnosis not present

## 2018-06-22 DIAGNOSIS — I10 Essential (primary) hypertension: Secondary | ICD-10-CM | POA: Diagnosis not present

## 2018-06-22 DIAGNOSIS — E039 Hypothyroidism, unspecified: Secondary | ICD-10-CM | POA: Diagnosis not present

## 2018-08-13 DIAGNOSIS — F331 Major depressive disorder, recurrent, moderate: Secondary | ICD-10-CM | POA: Diagnosis not present

## 2018-10-12 DIAGNOSIS — R6889 Other general symptoms and signs: Secondary | ICD-10-CM | POA: Diagnosis not present

## 2018-10-12 DIAGNOSIS — R21 Rash and other nonspecific skin eruption: Secondary | ICD-10-CM | POA: Diagnosis not present

## 2018-10-12 DIAGNOSIS — K219 Gastro-esophageal reflux disease without esophagitis: Secondary | ICD-10-CM | POA: Diagnosis not present

## 2018-11-12 DIAGNOSIS — F331 Major depressive disorder, recurrent, moderate: Secondary | ICD-10-CM | POA: Diagnosis not present

## 2018-11-26 DIAGNOSIS — F331 Major depressive disorder, recurrent, moderate: Secondary | ICD-10-CM | POA: Diagnosis not present

## 2018-12-03 DIAGNOSIS — F331 Major depressive disorder, recurrent, moderate: Secondary | ICD-10-CM | POA: Diagnosis not present

## 2019-01-04 DIAGNOSIS — E039 Hypothyroidism, unspecified: Secondary | ICD-10-CM | POA: Diagnosis not present

## 2019-01-04 DIAGNOSIS — J4520 Mild intermittent asthma, uncomplicated: Secondary | ICD-10-CM | POA: Diagnosis not present

## 2019-01-04 DIAGNOSIS — E7800 Pure hypercholesterolemia, unspecified: Secondary | ICD-10-CM | POA: Diagnosis not present

## 2019-01-04 DIAGNOSIS — L2089 Other atopic dermatitis: Secondary | ICD-10-CM | POA: Diagnosis not present

## 2019-01-21 DIAGNOSIS — F331 Major depressive disorder, recurrent, moderate: Secondary | ICD-10-CM | POA: Diagnosis not present

## 2019-01-28 DIAGNOSIS — F331 Major depressive disorder, recurrent, moderate: Secondary | ICD-10-CM | POA: Diagnosis not present

## 2019-02-15 DIAGNOSIS — M659 Synovitis and tenosynovitis, unspecified: Secondary | ICD-10-CM | POA: Diagnosis not present

## 2019-02-15 DIAGNOSIS — M109 Gout, unspecified: Secondary | ICD-10-CM | POA: Diagnosis not present

## 2019-02-15 DIAGNOSIS — M19072 Primary osteoarthritis, left ankle and foot: Secondary | ICD-10-CM | POA: Diagnosis not present

## 2019-02-15 DIAGNOSIS — M67372 Transient synovitis, left ankle and foot: Secondary | ICD-10-CM | POA: Diagnosis not present

## 2019-02-28 DIAGNOSIS — M722 Plantar fascial fibromatosis: Secondary | ICD-10-CM | POA: Diagnosis not present

## 2019-02-28 DIAGNOSIS — M7752 Other enthesopathy of left foot: Secondary | ICD-10-CM | POA: Diagnosis not present

## 2019-03-08 DIAGNOSIS — J4520 Mild intermittent asthma, uncomplicated: Secondary | ICD-10-CM | POA: Diagnosis not present

## 2019-03-08 DIAGNOSIS — L2089 Other atopic dermatitis: Secondary | ICD-10-CM | POA: Diagnosis not present

## 2019-03-08 DIAGNOSIS — E039 Hypothyroidism, unspecified: Secondary | ICD-10-CM | POA: Diagnosis not present

## 2019-03-08 DIAGNOSIS — E7800 Pure hypercholesterolemia, unspecified: Secondary | ICD-10-CM | POA: Diagnosis not present

## 2019-04-28 DIAGNOSIS — T148XXA Other injury of unspecified body region, initial encounter: Secondary | ICD-10-CM | POA: Diagnosis not present

## 2019-04-28 DIAGNOSIS — M25512 Pain in left shoulder: Secondary | ICD-10-CM | POA: Diagnosis not present

## 2019-04-28 DIAGNOSIS — M542 Cervicalgia: Secondary | ICD-10-CM | POA: Diagnosis not present

## 2019-07-12 DIAGNOSIS — M25512 Pain in left shoulder: Secondary | ICD-10-CM | POA: Diagnosis not present

## 2019-08-01 ENCOUNTER — Ambulatory Visit: Payer: 59 | Admitting: Podiatry

## 2019-08-15 DIAGNOSIS — M67814 Other specified disorders of tendon, left shoulder: Secondary | ICD-10-CM | POA: Diagnosis not present

## 2019-08-15 DIAGNOSIS — M6281 Muscle weakness (generalized): Secondary | ICD-10-CM | POA: Diagnosis not present

## 2019-08-23 DIAGNOSIS — M25512 Pain in left shoulder: Secondary | ICD-10-CM | POA: Diagnosis not present

## 2019-09-06 DIAGNOSIS — Z20828 Contact with and (suspected) exposure to other viral communicable diseases: Secondary | ICD-10-CM | POA: Diagnosis not present

## 2019-09-07 DIAGNOSIS — M67814 Other specified disorders of tendon, left shoulder: Secondary | ICD-10-CM | POA: Diagnosis not present

## 2019-09-07 DIAGNOSIS — M6281 Muscle weakness (generalized): Secondary | ICD-10-CM | POA: Diagnosis not present

## 2019-09-12 DIAGNOSIS — E079 Disorder of thyroid, unspecified: Secondary | ICD-10-CM | POA: Diagnosis not present

## 2019-09-12 DIAGNOSIS — R319 Hematuria, unspecified: Secondary | ICD-10-CM | POA: Diagnosis not present

## 2019-09-12 DIAGNOSIS — Z79899 Other long term (current) drug therapy: Secondary | ICD-10-CM | POA: Diagnosis not present

## 2019-09-12 DIAGNOSIS — R7989 Other specified abnormal findings of blood chemistry: Secondary | ICD-10-CM | POA: Diagnosis not present

## 2019-09-12 DIAGNOSIS — K219 Gastro-esophageal reflux disease without esophagitis: Secondary | ICD-10-CM | POA: Diagnosis not present

## 2019-09-12 DIAGNOSIS — F17290 Nicotine dependence, other tobacco product, uncomplicated: Secondary | ICD-10-CM | POA: Diagnosis not present

## 2019-09-20 DIAGNOSIS — R319 Hematuria, unspecified: Secondary | ICD-10-CM | POA: Diagnosis not present

## 2019-09-20 DIAGNOSIS — Z319 Encounter for procreative management, unspecified: Secondary | ICD-10-CM | POA: Diagnosis not present

## 2019-10-04 DIAGNOSIS — K7689 Other specified diseases of liver: Secondary | ICD-10-CM | POA: Diagnosis not present

## 2019-10-04 DIAGNOSIS — I708 Atherosclerosis of other arteries: Secondary | ICD-10-CM | POA: Diagnosis not present

## 2019-10-04 DIAGNOSIS — R319 Hematuria, unspecified: Secondary | ICD-10-CM | POA: Diagnosis not present

## 2019-12-02 DIAGNOSIS — F900 Attention-deficit hyperactivity disorder, predominantly inattentive type: Secondary | ICD-10-CM | POA: Diagnosis not present

## 2019-12-02 DIAGNOSIS — F4321 Adjustment disorder with depressed mood: Secondary | ICD-10-CM | POA: Diagnosis not present

## 2019-12-19 ENCOUNTER — Ambulatory Visit (INDEPENDENT_AMBULATORY_CARE_PROVIDER_SITE_OTHER): Payer: BC Managed Care – PPO

## 2019-12-19 ENCOUNTER — Ambulatory Visit: Payer: BC Managed Care – PPO | Admitting: Podiatry

## 2019-12-19 ENCOUNTER — Other Ambulatory Visit: Payer: Self-pay

## 2019-12-19 DIAGNOSIS — M898X7 Other specified disorders of bone, ankle and foot: Secondary | ICD-10-CM | POA: Diagnosis not present

## 2019-12-19 DIAGNOSIS — G5792 Unspecified mononeuropathy of left lower limb: Secondary | ICD-10-CM | POA: Diagnosis not present

## 2019-12-19 NOTE — Patient Instructions (Addendum)
Pre-Operative Instructions  Congratulations, you have decided to take an important step towards improving your quality of life.  You can be assured that the doctors and staff at [facility] will be with you every step of the way.  Here are some important things you should know:  1. Plan to be at the surgery center/hospital at least 1 (one) hour prior to your scheduled time, unless otherwise directed by the surgical center/hospital staff.  You must have a responsible adult accompany you, remain during the surgery and drive you home.  Make sure you have directions to the surgical center/hospital to ensure you arrive on time. 2. If you are having surgery at [facility] or [facility], you will need a copy of your medical history and physical form from your family physician within one month prior to the date of surgery. We will give you a form for your primary physician to complete.  3. We make every effort to accommodate the date you request for surgery.  However, there are times where surgery dates or times have to be moved.  We will contact you as soon as possible if a change in schedule is required.   4. No aspirin/ibuprofen for one week before surgery.  If you are on aspirin, any non-steroidal anti-inflammatory medications (Mobic, Aleve, Ibuprofen) should not be taken seven (7) days prior to your surgery.  You make take Tylenol for pain prior to surgery.  5. Medications - If you are taking daily heart and blood pressure medications, seizure, reflux, allergy, asthma, anxiety, pain or diabetes medications, make sure you notify the surgery center/hospital before the day of surgery so they can tell you which medications you should take or avoid the day of surgery. 6. No food or drink after midnight the night before surgery unless directed otherwise by surgical center/hospital staff. 7. No alcoholic beverages 24-hours prior to surgery.  No smoking 24-hours prior or 24-hours after  surgery. 8. Wear loose pants or shorts. They should be loose enough to fit over bandages, boots, and casts. 9. Don't wear slip-on shoes. Sneakers are preferred. 10. Bring your boot with you to the surgery center/hospital.  Also bring crutches or a walker if your physician has prescribed it for you.  If you do not have this equipment, it will be provided for you after surgery. 11. If you have not been contacted by the surgery center/hospital by the day before your surgery, call to confirm the date and time of your surgery. 12. Leave-time from work may vary depending on the type of surgery you have.  Appropriate arrangements should be made prior to surgery with your employer. 13. Prescriptions will be provided immediately following surgery by your doctor.  Fill these as soon as possible after surgery and take the medication as directed. Pain medications will not be refilled on weekends and must be approved by the doctor. 14. Remove nail polish on the operative foot and avoid getting pedicures prior to surgery. 15. Wash the night before surgery.  The night before surgery wash the foot and leg well with water and the antibacterial soap provided. Be sure to pay special attention to beneath the toenails and in between the toes.  Wash for at least three (3) minutes. Rinse thoroughly with water and dry well with a towel.  Perform this wash unless told not to do so by your physician.  Enclosed: 1 Ice pack (please put in freezer the night before surgery)   1 Hibiclens skin cleaner     Pre-op instructions  If you have any questions regarding the instructions, please do not hesitate to call our office.  [place]: [address], [place], [place] [address] -- [phone]  : [address] , [place] [address] -- [phone]  [place]: [address], [place], [place] [address] -- [phone]   Website: [email or web address] 

## 2019-12-25 NOTE — Progress Notes (Signed)
   HPI: 59 y.o. male presenting today as a new patient, referred by Dr. Gershon Mussel, with a chief complaint of pain to the left dorsal midfoot that has been gradually worsening over the past two years. He reports associated intermittent swelling of the foot. There are no aggravating factors noted. He has received injections and has used a CAM boot for treatment. Patient is here for further evaluation and treatment.   Past Medical History:  Diagnosis Date  . Asthma   . Thyroid disease      Physical Exam: General: The patient is alert and oriented x3 in no acute distress.  Dermatology: Skin is warm, dry and supple bilateral lower extremities. Negative for open lesions or macerations.  Vascular: Palpable pedal pulses bilaterally. No edema or erythema noted. Capillary refill within normal limits.  Neurological: Epicritic and protective threshold intact.  Paresthesia with burning, shooting pain noted to the left foot.   Musculoskeletal Exam: Range of motion within normal limits to all pedal and ankle joints bilateral. Muscle strength 5/5 in all groups bilateral.   Radiographic Exam:  Exostosis noted to the 1st met-cuneiform of the left foot.     Assessment: 1. 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 Neuritis left    Plan of Care:  1. Patient evaluated. X-Rays reviewed.  2. Today we discussed the conservative versus surgical management of the presenting pathology. The patient opts for surgical management. All possible complications and details of the procedure were explained. All patient questions were answered. No guarantees were expressed or implied. 3. Authorization for surgery was initiated today. Surgery will consist of 1st met-cuneiform exostectomy left.  4. Return to clinic one week post op.    Works for [employer].      Edrick Kins, DPM [facility]  Dr. Edrick Kins, DPM    [address], [place] [address]                 Office [phone]  Fax [phone]

## 2020-01-05 DIAGNOSIS — F411 Generalized anxiety disorder: Secondary | ICD-10-CM | POA: Diagnosis not present

## 2020-01-05 DIAGNOSIS — R42 Dizziness and giddiness: Secondary | ICD-10-CM | POA: Diagnosis not present

## 2020-01-05 DIAGNOSIS — E039 Hypothyroidism, unspecified: Secondary | ICD-10-CM | POA: Diagnosis not present

## 2020-02-02 ENCOUNTER — Telehealth: Payer: Self-pay

## 2020-02-02 NOTE — Telephone Encounter (Signed)
DOS 02/16/20  TARSAL EXOSTECTOMY X 2 LT - 73668 NEUROPLASTY OF DIGITAL NERVE LT - 64704  [employer] EFFECTIVE DATE - 10/28/2019    In-Network   Max Per Benefit Period Year-to-Date Remaining     CoInsurance 10%       Deductible $500.00 $500.00     Out-Of-Pocket 3 $1500.00 $1215.39  Copay Not Applicable Coinsurance 10%  per  Service Year Authorization Required No

## 2020-02-10 DIAGNOSIS — L2089 Other atopic dermatitis: Secondary | ICD-10-CM | POA: Diagnosis not present

## 2020-02-10 DIAGNOSIS — M25512 Pain in left shoulder: Secondary | ICD-10-CM | POA: Diagnosis not present

## 2020-02-10 DIAGNOSIS — J029 Acute pharyngitis, unspecified: Secondary | ICD-10-CM | POA: Diagnosis not present

## 2020-02-10 DIAGNOSIS — M25511 Pain in right shoulder: Secondary | ICD-10-CM | POA: Diagnosis not present

## 2020-02-16 ENCOUNTER — Other Ambulatory Visit: Payer: Self-pay | Admitting: Podiatry

## 2020-02-16 DIAGNOSIS — K219 Gastro-esophageal reflux disease without esophagitis: Secondary | ICD-10-CM | POA: Diagnosis not present

## 2020-02-16 DIAGNOSIS — M792 Neuralgia and neuritis, unspecified: Secondary | ICD-10-CM | POA: Diagnosis not present

## 2020-02-16 DIAGNOSIS — G5792 Unspecified mononeuropathy of left lower limb: Secondary | ICD-10-CM | POA: Diagnosis not present

## 2020-02-16 DIAGNOSIS — M898X7 Other specified disorders of bone, ankle and foot: Secondary | ICD-10-CM | POA: Diagnosis not present

## 2020-02-16 MED ORDER — OXYCODONE-ACETAMINOPHEN 5-325 MG PO TABS: 1.0000 | ORAL_TABLET | Freq: Four times a day (QID) | ORAL | 0 refills | Status: DC | PRN

## 2020-02-16 NOTE — Progress Notes (Signed)
PRN postop 

## 2020-02-17 ENCOUNTER — Telehealth: Payer: Self-pay | Admitting: Podiatry

## 2020-02-17 DIAGNOSIS — Z20828 Contact with and (suspected) exposure to other viral communicable diseases: Secondary | ICD-10-CM | POA: Diagnosis not present

## 2020-02-17 DIAGNOSIS — Z03818 Encounter for observation for suspected exposure to other biological agents ruled out: Secondary | ICD-10-CM | POA: Diagnosis not present

## 2020-02-17 NOTE — Telephone Encounter (Signed)
Pt called and stated that he did not get his pain medication but it was called to in to the wrong pharmacy. Please re [facility] [address] [place] [address] please advise pt states hes in a lot of pain

## 2020-02-17 NOTE — Telephone Encounter (Signed)
I told pt's wife, Lupita Leash narcotic pain medication had been sent to the [facility] [address]. Lupita Leash states she will go get the medication after work. I told Lupita Leash Dr. Logan Bores did not go to [place], so pt would benefit from keeping the 02/22/2020 appt in [place]. I told Lupita Leash I would contact pt to  Help with pain management.

## 2020-02-17 NOTE — Telephone Encounter (Addendum)
I called pt and asked him to describe the pain he felt and he stated throbbing. I informed pt that an ace wrap was applied after surgery to staunch any post op bleeding and to hold out swelling and sometimes after it had done it's job it was too tight. I told pt to remove the cam boot, open-ended sock and ace wrap only, then elevate the foot 15 minutes, but if pain worsened then dangle it this being the only time it was okay to dangle, after 15 minutes either way, then put foot level with his hip and rewrap the ace beginning at the toes and roll down the foot and up the leg just snug enough to hold the wrap in place and retape with medical, office or duct tape the the ace wrap to hold in place, replace sock and boot.

## 2020-02-17 NOTE — Telephone Encounter (Signed)
My had sx yesterday and I was calling to see if there was anyway he could change his pain medication to get something stronger. The 500 mg neproxine did not work well with him. He was up all night with pain shooting through his foot. My number is [phone].  Also, we live in [place] and his first follow up appointment is 04/28 at 8:15 in [place]. I was wondering if we could get that changed to the [place] location and if not, its okay. Thank you.

## 2020-02-22 ENCOUNTER — Other Ambulatory Visit: Payer: Self-pay

## 2020-02-22 ENCOUNTER — Ambulatory Visit (INDEPENDENT_AMBULATORY_CARE_PROVIDER_SITE_OTHER): Payer: BC Managed Care – PPO | Admitting: Podiatry

## 2020-02-22 ENCOUNTER — Ambulatory Visit (INDEPENDENT_AMBULATORY_CARE_PROVIDER_SITE_OTHER): Payer: BC Managed Care – PPO

## 2020-02-22 DIAGNOSIS — G5792 Unspecified mononeuropathy of left lower limb: Secondary | ICD-10-CM

## 2020-02-22 DIAGNOSIS — M898X7 Other specified disorders of bone, ankle and foot: Secondary | ICD-10-CM | POA: Diagnosis not present

## 2020-02-22 DIAGNOSIS — Z9889 Other specified postprocedural states: Secondary | ICD-10-CM

## 2020-02-22 MED ORDER — OXYCODONE-ACETAMINOPHEN 5-325 MG PO TABS: 1.0000 | ORAL_TABLET | Freq: Four times a day (QID) | ORAL | 0 refills | Status: DC | PRN

## 2020-02-24 NOTE — Progress Notes (Signed)
   Subjective:  Patient presents today status post 1st metatarsal / cuneiform exostectomy left. DOS: 02/16/2020. He reports throbbing pain and associated swelling. He has been taking pain medication, icing and elevating the foot for treatment. He has been using the CAM boot as directed. Patient is here for further evaluation and treatment.    Past Medical History:  Diagnosis Date  . Asthma   . Thyroid disease       Objective/Physical Exam Neurovascular status intact.  Skin incisions appear to be well coapted with sutures and staples intact. No sign of infectious process noted. No dehiscence. No active bleeding noted. Moderate edema noted to the surgical extremity.  Radiographic Exam:  Orthopedic hardware and osteotomies sites appear to be stable with routine healing.  Assessment: 1. s/p 1st metatarsal / cuneiform exostectomy left. DOS: 02/16/2020   Plan of Care:  1. Patient was evaluated. X-rays reviewed 2. Dressing changed.  3. Post op shoe dispensed. Discontinue using CAM boot.  4. Refill prescription for Percocet 5/325 mg provided to patient.  5. Return to clinic in one week for staple removal.   Works for [employer].    Felecia Shelling, DPM [facility]  Dr. Felecia Shelling, DPM    [address]                                        [place], [place] [address]                Office [phone]  Fax [phone]

## 2020-02-27 ENCOUNTER — Ambulatory Visit (INDEPENDENT_AMBULATORY_CARE_PROVIDER_SITE_OTHER): Payer: BC Managed Care – PPO | Admitting: Podiatry

## 2020-02-27 ENCOUNTER — Other Ambulatory Visit: Payer: Self-pay

## 2020-02-27 DIAGNOSIS — G5792 Unspecified mononeuropathy of left lower limb: Secondary | ICD-10-CM

## 2020-02-27 DIAGNOSIS — M898X7 Other specified disorders of bone, ankle and foot: Secondary | ICD-10-CM

## 2020-02-27 DIAGNOSIS — Z9889 Other specified postprocedural states: Secondary | ICD-10-CM

## 2020-02-29 NOTE — Progress Notes (Signed)
   Subjective:  Patient presents today status post 1st metatarsal / cuneiform exostectomy left. DOS: 02/16/2020. He states he is doing well overall. He reports some swelling and tingling of the foot. He reports associated discoloration of the 2nd toe stating it is turning purple. He has been taking pain medication for treatment. He has been using the post op shoe but states it does not help. Patient is here for further evaluation and treatment.   Past Medical History:  Diagnosis Date  . Asthma   . Thyroid disease       Objective/Physical Exam Neurovascular status intact.  Skin incisions appear to be well coapted with sutures and staples intact. No sign of infectious process noted. No dehiscence. No active bleeding noted. Moderate edema noted to the surgical extremity.  Assessment: 1. s/p 1st metatarsal / cuneiform exostectomy left. DOS: 02/16/2020   Plan of Care:  1. Patient was evaluated.  2. Staples removed.  3. Discontinue using post op shoe. Recommended good shoe gear.  4. Extension to stay out of work provided.  5. Return to clinic in 4 weeks for final follow up.    Works for [employer].    Felecia Shelling, DPM [facility]  Dr. Felecia Shelling, DPM    [address]                                        [place], [place] [address]                Office [phone]  Fax [phone]

## 2020-03-01 ENCOUNTER — Encounter: Payer: Self-pay | Admitting: Podiatry

## 2020-03-05 ENCOUNTER — Encounter: Payer: BC Managed Care – PPO | Admitting: Podiatry

## 2020-03-08 ENCOUNTER — Other Ambulatory Visit: Payer: Self-pay

## 2020-03-08 ENCOUNTER — Ambulatory Visit (INDEPENDENT_AMBULATORY_CARE_PROVIDER_SITE_OTHER): Payer: BC Managed Care – PPO | Admitting: Orthotics

## 2020-03-08 DIAGNOSIS — M898X7 Other specified disorders of bone, ankle and foot: Secondary | ICD-10-CM

## 2020-03-08 NOTE — Progress Notes (Signed)
Cast today for CMFO to give arch support secondary to eostosis of left foot.

## 2020-03-15 DIAGNOSIS — F900 Attention-deficit hyperactivity disorder, predominantly inattentive type: Secondary | ICD-10-CM | POA: Diagnosis not present

## 2020-03-15 DIAGNOSIS — F410 Panic disorder [episodic paroxysmal anxiety] without agoraphobia: Secondary | ICD-10-CM | POA: Diagnosis not present

## 2020-03-15 DIAGNOSIS — F3342 Major depressive disorder, recurrent, in full remission: Secondary | ICD-10-CM | POA: Diagnosis not present

## 2020-03-15 DIAGNOSIS — F4322 Adjustment disorder with anxiety: Secondary | ICD-10-CM | POA: Diagnosis not present

## 2020-03-19 ENCOUNTER — Ambulatory Visit (INDEPENDENT_AMBULATORY_CARE_PROVIDER_SITE_OTHER): Payer: BC Managed Care – PPO | Admitting: Podiatry

## 2020-03-19 ENCOUNTER — Encounter: Payer: Self-pay | Admitting: Podiatry

## 2020-03-19 ENCOUNTER — Other Ambulatory Visit: Payer: Self-pay

## 2020-03-19 ENCOUNTER — Ambulatory Visit (INDEPENDENT_AMBULATORY_CARE_PROVIDER_SITE_OTHER): Payer: BC Managed Care – PPO

## 2020-03-19 DIAGNOSIS — M898X7 Other specified disorders of bone, ankle and foot: Secondary | ICD-10-CM

## 2020-03-19 DIAGNOSIS — Z9889 Other specified postprocedural states: Secondary | ICD-10-CM

## 2020-03-21 NOTE — Progress Notes (Signed)
   Subjective:  Patient presents today status post 1st metatarsal / cuneiform exostectomy left. DOS: 02/16/2020. He states he is doing well and improving. He reports some mild to moderate pain depending on his activity level for the day. He reports associated intermittent swelling. He has been taking Advil for treatment which has been helping to alleviate the symptoms. He uses his custom orthotics as directed. Patient is here for further evaluation and treatment.   Past Medical History:  Diagnosis Date  . Asthma   . Thyroid disease       Objective/Physical Exam Neurovascular status intact.  Skin incisions appear to be well coapted. No sign of infectious process noted. No dehiscence. No active bleeding noted. Moderate edema noted to the surgical extremity.  Assessment: 1. s/p 1st metatarsal / cuneiform exostectomy left. DOS: 02/16/2020   Plan of Care:  1. Patient was evaluated.  2. Continue using custom orthotics.  3. May resume full activity with no restrictions.  4. Return to clinic as needed.     Works for [employer]. Going to DR for one year anniversary.    Felecia Shelling, DPM [facility]  Dr. Felecia Shelling, DPM    [address]                                        [place], [place] [address]                Office [phone]  Fax [phone]

## 2020-03-29 ENCOUNTER — Other Ambulatory Visit: Payer: Self-pay

## 2020-03-29 ENCOUNTER — Ambulatory Visit: Payer: BC Managed Care – PPO | Admitting: Orthotics

## 2020-03-29 DIAGNOSIS — M898X7 Other specified disorders of bone, ankle and foot: Secondary | ICD-10-CM

## 2020-03-29 DIAGNOSIS — Z9889 Other specified postprocedural states: Secondary | ICD-10-CM

## 2020-03-29 NOTE — Progress Notes (Signed)
Patient came in today to pick up custom made foot orthotics.  The goals were accomplished and the patient reported no dissatisfaction with said orthotics.  Patient was advised of breakin period and how to report any issues. 

## 2020-04-06 ENCOUNTER — Telehealth: Payer: Self-pay | Admitting: Podiatry

## 2020-04-06 NOTE — Telephone Encounter (Signed)
Pt had surgery on 02/16/20 and had a bone spur removed. Pt is still having some pain in the foot and wanted to discuss if what he is experiencing is normal. Please give patient a call back

## 2020-04-06 NOTE — Telephone Encounter (Signed)
I spoke with pt and he states he has sharp shooting pains periodically through the day and no particular trigger. I told pt that he may have pain, swelling on and off to varying degrees for 6-9 months, that he should rest, ice and decrease his exercise activity, and if he was continuing to have the pain call for an appt in 1 month. Pt agreed.

## 2020-04-12 DIAGNOSIS — F4322 Adjustment disorder with anxiety: Secondary | ICD-10-CM | POA: Diagnosis not present

## 2020-04-12 DIAGNOSIS — F4321 Adjustment disorder with depressed mood: Secondary | ICD-10-CM | POA: Diagnosis not present

## 2020-04-19 DIAGNOSIS — F331 Major depressive disorder, recurrent, moderate: Secondary | ICD-10-CM | POA: Diagnosis not present

## 2020-04-23 DIAGNOSIS — J0100 Acute maxillary sinusitis, unspecified: Secondary | ICD-10-CM | POA: Diagnosis not present

## 2020-05-15 DIAGNOSIS — N401 Enlarged prostate with lower urinary tract symptoms: Secondary | ICD-10-CM | POA: Diagnosis not present

## 2020-05-17 DIAGNOSIS — Z20822 Contact with and (suspected) exposure to covid-19: Secondary | ICD-10-CM | POA: Diagnosis not present

## 2020-05-17 DIAGNOSIS — Z03818 Encounter for observation for suspected exposure to other biological agents ruled out: Secondary | ICD-10-CM | POA: Diagnosis not present

## 2020-05-29 DIAGNOSIS — F4321 Adjustment disorder with depressed mood: Secondary | ICD-10-CM | POA: Diagnosis not present

## 2020-05-29 DIAGNOSIS — F3341 Major depressive disorder, recurrent, in partial remission: Secondary | ICD-10-CM | POA: Diagnosis not present

## 2020-07-12 DIAGNOSIS — Z0000 Encounter for general adult medical examination without abnormal findings: Secondary | ICD-10-CM | POA: Diagnosis not present

## 2020-07-12 DIAGNOSIS — E7800 Pure hypercholesterolemia, unspecified: Secondary | ICD-10-CM | POA: Diagnosis not present

## 2020-07-12 DIAGNOSIS — Z23 Encounter for immunization: Secondary | ICD-10-CM | POA: Diagnosis not present

## 2020-07-12 DIAGNOSIS — Z125 Encounter for screening for malignant neoplasm of prostate: Secondary | ICD-10-CM | POA: Diagnosis not present

## 2020-07-12 DIAGNOSIS — E039 Hypothyroidism, unspecified: Secondary | ICD-10-CM | POA: Diagnosis not present

## 2020-07-26 DIAGNOSIS — F331 Major depressive disorder, recurrent, moderate: Secondary | ICD-10-CM | POA: Diagnosis not present

## 2020-08-04 DIAGNOSIS — F331 Major depressive disorder, recurrent, moderate: Secondary | ICD-10-CM | POA: Diagnosis not present

## 2020-08-16 DIAGNOSIS — Z23 Encounter for immunization: Secondary | ICD-10-CM | POA: Diagnosis not present

## 2020-08-16 DIAGNOSIS — F3341 Major depressive disorder, recurrent, in partial remission: Secondary | ICD-10-CM | POA: Diagnosis not present

## 2020-08-16 DIAGNOSIS — F4321 Adjustment disorder with depressed mood: Secondary | ICD-10-CM | POA: Diagnosis not present

## 2020-08-30 DIAGNOSIS — F331 Major depressive disorder, recurrent, moderate: Secondary | ICD-10-CM | POA: Diagnosis not present

## 2020-09-27 DIAGNOSIS — M7521 Bicipital tendinitis, right shoulder: Secondary | ICD-10-CM | POA: Diagnosis not present

## 2020-10-12 DIAGNOSIS — F331 Major depressive disorder, recurrent, moderate: Secondary | ICD-10-CM | POA: Diagnosis not present

## 2020-11-02 DIAGNOSIS — Z20822 Contact with and (suspected) exposure to covid-19: Secondary | ICD-10-CM | POA: Diagnosis not present

## 2020-11-02 DIAGNOSIS — Z03818 Encounter for observation for suspected exposure to other biological agents ruled out: Secondary | ICD-10-CM | POA: Diagnosis not present

## 2020-11-14 DIAGNOSIS — M545 Low back pain, unspecified: Secondary | ICD-10-CM | POA: Diagnosis not present

## 2020-11-14 DIAGNOSIS — M7918 Myalgia, other site: Secondary | ICD-10-CM | POA: Diagnosis not present

## 2020-11-14 DIAGNOSIS — M47896 Other spondylosis, lumbar region: Secondary | ICD-10-CM | POA: Diagnosis not present

## 2020-11-14 DIAGNOSIS — M5136 Other intervertebral disc degeneration, lumbar region: Secondary | ICD-10-CM | POA: Diagnosis not present

## 2020-11-15 DIAGNOSIS — N401 Enlarged prostate with lower urinary tract symptoms: Secondary | ICD-10-CM | POA: Diagnosis not present

## 2020-11-15 DIAGNOSIS — Z87448 Personal history of other diseases of urinary system: Secondary | ICD-10-CM | POA: Diagnosis not present

## 2020-11-15 DIAGNOSIS — Z3141 Encounter for fertility testing: Secondary | ICD-10-CM | POA: Diagnosis not present

## 2020-11-15 DIAGNOSIS — F331 Major depressive disorder, recurrent, moderate: Secondary | ICD-10-CM | POA: Diagnosis not present

## 2020-11-15 DIAGNOSIS — R3912 Poor urinary stream: Secondary | ICD-10-CM | POA: Diagnosis not present

## 2020-11-22 DIAGNOSIS — Z23 Encounter for immunization: Secondary | ICD-10-CM | POA: Diagnosis not present

## 2020-12-13 DIAGNOSIS — F331 Major depressive disorder, recurrent, moderate: Secondary | ICD-10-CM | POA: Diagnosis not present

## 2020-12-27 DIAGNOSIS — F331 Major depressive disorder, recurrent, moderate: Secondary | ICD-10-CM | POA: Diagnosis not present

## 2021-01-03 DIAGNOSIS — M5136 Other intervertebral disc degeneration, lumbar region: Secondary | ICD-10-CM | POA: Diagnosis not present

## 2021-01-03 DIAGNOSIS — M47896 Other spondylosis, lumbar region: Secondary | ICD-10-CM | POA: Diagnosis not present

## 2021-01-03 DIAGNOSIS — M545 Low back pain, unspecified: Secondary | ICD-10-CM | POA: Diagnosis not present

## 2021-01-10 DIAGNOSIS — F331 Major depressive disorder, recurrent, moderate: Secondary | ICD-10-CM | POA: Diagnosis not present

## 2021-01-21 DIAGNOSIS — Z20822 Contact with and (suspected) exposure to covid-19: Secondary | ICD-10-CM | POA: Diagnosis not present

## 2021-01-21 DIAGNOSIS — Z03818 Encounter for observation for suspected exposure to other biological agents ruled out: Secondary | ICD-10-CM | POA: Diagnosis not present

## 2021-01-21 DIAGNOSIS — R6889 Other general symptoms and signs: Secondary | ICD-10-CM | POA: Diagnosis not present

## 2021-01-23 DIAGNOSIS — R6883 Chills (without fever): Secondary | ICD-10-CM | POA: Diagnosis not present

## 2021-01-23 DIAGNOSIS — Z03818 Encounter for observation for suspected exposure to other biological agents ruled out: Secondary | ICD-10-CM | POA: Diagnosis not present

## 2021-01-23 DIAGNOSIS — R5383 Other fatigue: Secondary | ICD-10-CM | POA: Diagnosis not present

## 2021-01-24 DIAGNOSIS — M25511 Pain in right shoulder: Secondary | ICD-10-CM | POA: Diagnosis not present

## 2021-01-24 DIAGNOSIS — M67911 Unspecified disorder of synovium and tendon, right shoulder: Secondary | ICD-10-CM | POA: Diagnosis not present

## 2021-01-29 DIAGNOSIS — M25511 Pain in right shoulder: Secondary | ICD-10-CM | POA: Diagnosis not present

## 2021-02-04 DIAGNOSIS — M67911 Unspecified disorder of synovium and tendon, right shoulder: Secondary | ICD-10-CM | POA: Diagnosis not present

## 2021-02-04 DIAGNOSIS — M19011 Primary osteoarthritis, right shoulder: Secondary | ICD-10-CM | POA: Diagnosis not present

## 2021-02-07 DIAGNOSIS — F900 Attention-deficit hyperactivity disorder, predominantly inattentive type: Secondary | ICD-10-CM | POA: Diagnosis not present

## 2021-02-07 DIAGNOSIS — F331 Major depressive disorder, recurrent, moderate: Secondary | ICD-10-CM | POA: Diagnosis not present

## 2021-02-07 DIAGNOSIS — F411 Generalized anxiety disorder: Secondary | ICD-10-CM | POA: Diagnosis not present

## 2021-02-13 DIAGNOSIS — R339 Retention of urine, unspecified: Secondary | ICD-10-CM | POA: Diagnosis not present

## 2021-02-28 DIAGNOSIS — F331 Major depressive disorder, recurrent, moderate: Secondary | ICD-10-CM | POA: Diagnosis not present

## 2021-02-28 DIAGNOSIS — M6281 Muscle weakness (generalized): Secondary | ICD-10-CM | POA: Diagnosis not present

## 2021-02-28 DIAGNOSIS — M47896 Other spondylosis, lumbar region: Secondary | ICD-10-CM | POA: Diagnosis not present

## 2021-03-18 DIAGNOSIS — R197 Diarrhea, unspecified: Secondary | ICD-10-CM | POA: Diagnosis not present

## 2021-03-20 ENCOUNTER — Ambulatory Visit: Payer: BC Managed Care – PPO | Admitting: Podiatry

## 2021-03-21 DIAGNOSIS — Z87448 Personal history of other diseases of urinary system: Secondary | ICD-10-CM | POA: Diagnosis not present

## 2021-03-21 DIAGNOSIS — R3912 Poor urinary stream: Secondary | ICD-10-CM | POA: Diagnosis not present

## 2021-03-21 DIAGNOSIS — Z5309 Procedure and treatment not carried out because of other contraindication: Secondary | ICD-10-CM | POA: Diagnosis not present

## 2021-03-21 DIAGNOSIS — N401 Enlarged prostate with lower urinary tract symptoms: Secondary | ICD-10-CM | POA: Diagnosis not present

## 2021-03-28 DIAGNOSIS — F422 Mixed obsessional thoughts and acts: Secondary | ICD-10-CM | POA: Diagnosis not present

## 2021-03-28 DIAGNOSIS — F3342 Major depressive disorder, recurrent, in full remission: Secondary | ICD-10-CM | POA: Diagnosis not present

## 2021-03-28 DIAGNOSIS — F900 Attention-deficit hyperactivity disorder, predominantly inattentive type: Secondary | ICD-10-CM | POA: Diagnosis not present

## 2021-03-28 DIAGNOSIS — F410 Panic disorder [episodic paroxysmal anxiety] without agoraphobia: Secondary | ICD-10-CM | POA: Diagnosis not present

## 2021-05-16 DIAGNOSIS — F331 Major depressive disorder, recurrent, moderate: Secondary | ICD-10-CM | POA: Diagnosis not present

## 2021-05-17 DIAGNOSIS — E039 Hypothyroidism, unspecified: Secondary | ICD-10-CM | POA: Diagnosis not present

## 2021-05-17 DIAGNOSIS — F17290 Nicotine dependence, other tobacco product, uncomplicated: Secondary | ICD-10-CM | POA: Diagnosis not present

## 2021-05-17 DIAGNOSIS — R3912 Poor urinary stream: Secondary | ICD-10-CM | POA: Diagnosis not present

## 2021-05-17 DIAGNOSIS — I10 Essential (primary) hypertension: Secondary | ICD-10-CM | POA: Diagnosis not present

## 2021-05-17 DIAGNOSIS — N401 Enlarged prostate with lower urinary tract symptoms: Secondary | ICD-10-CM | POA: Diagnosis not present

## 2021-05-17 DIAGNOSIS — J4520 Mild intermittent asthma, uncomplicated: Secondary | ICD-10-CM | POA: Diagnosis not present

## 2021-05-17 DIAGNOSIS — Z87448 Personal history of other diseases of urinary system: Secondary | ICD-10-CM | POA: Diagnosis not present

## 2021-05-17 DIAGNOSIS — K219 Gastro-esophageal reflux disease without esophagitis: Secondary | ICD-10-CM | POA: Diagnosis not present

## 2021-05-17 DIAGNOSIS — Z888 Allergy status to other drugs, medicaments and biological substances status: Secondary | ICD-10-CM | POA: Diagnosis not present

## 2021-05-23 DIAGNOSIS — M67911 Unspecified disorder of synovium and tendon, right shoulder: Secondary | ICD-10-CM | POA: Diagnosis not present

## 2021-05-23 DIAGNOSIS — M19011 Primary osteoarthritis, right shoulder: Secondary | ICD-10-CM | POA: Diagnosis not present

## 2021-05-23 DIAGNOSIS — M67912 Unspecified disorder of synovium and tendon, left shoulder: Secondary | ICD-10-CM | POA: Diagnosis not present

## 2021-05-24 DIAGNOSIS — J029 Acute pharyngitis, unspecified: Secondary | ICD-10-CM | POA: Diagnosis not present

## 2021-07-09 DIAGNOSIS — F331 Major depressive disorder, recurrent, moderate: Secondary | ICD-10-CM | POA: Diagnosis not present

## 2021-07-22 DIAGNOSIS — Z3141 Encounter for fertility testing: Secondary | ICD-10-CM | POA: Diagnosis not present

## 2021-08-01 ENCOUNTER — Telehealth: Payer: Self-pay | Admitting: Podiatry

## 2021-08-01 NOTE — Telephone Encounter (Signed)
Pt left message stating he needed to be fitted for new inserts.  I returned call and left message for pt to call to schedule an appt with Dr Logan Bores in [place] office.

## 2021-08-22 DIAGNOSIS — J4520 Mild intermittent asthma, uncomplicated: Secondary | ICD-10-CM | POA: Diagnosis not present

## 2021-08-22 DIAGNOSIS — N528 Other male erectile dysfunction: Secondary | ICD-10-CM | POA: Diagnosis not present

## 2021-08-22 DIAGNOSIS — E039 Hypothyroidism, unspecified: Secondary | ICD-10-CM | POA: Diagnosis not present

## 2021-08-22 DIAGNOSIS — Z23 Encounter for immunization: Secondary | ICD-10-CM | POA: Diagnosis not present

## 2021-08-22 DIAGNOSIS — G4730 Sleep apnea, unspecified: Secondary | ICD-10-CM | POA: Diagnosis not present

## 2021-08-28 DIAGNOSIS — N4601 Organic azoospermia: Secondary | ICD-10-CM | POA: Diagnosis not present

## 2021-08-30 ENCOUNTER — Other Ambulatory Visit: Payer: Self-pay

## 2021-08-30 ENCOUNTER — Emergency Department (HOSPITAL_BASED_OUTPATIENT_CLINIC_OR_DEPARTMENT_OTHER)
Admission: EM | Admit: 2021-08-30 | Discharge: 2021-08-31 | Disposition: A | Discharge status: Home / Self Care | Payer: BC Managed Care – PPO | Attending: Emergency Medicine | Admitting: Emergency Medicine

## 2021-08-30 ENCOUNTER — Emergency Department (HOSPITAL_COMMUNITY): Payer: BC Managed Care – PPO

## 2021-08-30 ENCOUNTER — Emergency Department (HOSPITAL_BASED_OUTPATIENT_CLINIC_OR_DEPARTMENT_OTHER): Payer: BC Managed Care – PPO

## 2021-08-30 ENCOUNTER — Encounter (HOSPITAL_BASED_OUTPATIENT_CLINIC_OR_DEPARTMENT_OTHER): Payer: Self-pay

## 2021-08-30 DIAGNOSIS — R200 Anesthesia of skin: Secondary | ICD-10-CM | POA: Diagnosis not present

## 2021-08-30 DIAGNOSIS — R202 Paresthesia of skin: Secondary | ICD-10-CM | POA: Diagnosis not present

## 2021-08-30 DIAGNOSIS — M4803 Spinal stenosis, cervicothoracic region: Secondary | ICD-10-CM | POA: Diagnosis not present

## 2021-08-30 DIAGNOSIS — R079 Chest pain, unspecified: Secondary | ICD-10-CM | POA: Diagnosis not present

## 2021-08-30 DIAGNOSIS — F17290 Nicotine dependence, other tobacco product, uncomplicated: Secondary | ICD-10-CM | POA: Insufficient documentation

## 2021-08-30 DIAGNOSIS — R0789 Other chest pain: Secondary | ICD-10-CM | POA: Insufficient documentation

## 2021-08-30 DIAGNOSIS — M5021 Other cervical disc displacement,  high cervical region: Secondary | ICD-10-CM | POA: Diagnosis not present

## 2021-08-30 DIAGNOSIS — J4520 Mild intermittent asthma, uncomplicated: Secondary | ICD-10-CM | POA: Insufficient documentation

## 2021-08-30 DIAGNOSIS — R29818 Other symptoms and signs involving the nervous system: Secondary | ICD-10-CM | POA: Diagnosis not present

## 2021-08-30 DIAGNOSIS — M50221 Other cervical disc displacement at C4-C5 level: Secondary | ICD-10-CM | POA: Diagnosis not present

## 2021-08-30 DIAGNOSIS — M4802 Spinal stenosis, cervical region: Secondary | ICD-10-CM | POA: Diagnosis not present

## 2021-08-30 HISTORY — DX: Depression, unspecified: F32.A

## 2021-08-30 LAB — TROPONIN I (HIGH SENSITIVITY)
Troponin I (High Sensitivity): 3 ng/L (ref <18)
Troponin I (High Sensitivity): 3 ng/L (ref <18)

## 2021-08-30 LAB — BASIC METABOLIC PANEL
Anion gap: 6 (ref 5–15)
BUN: 16 mg/dL (ref 8–23)
CO2: 28 mmol/L (ref 22–32)
Calcium: 9.2 mg/dL (ref 8.9–10.3)
Chloride: 104 mmol/L (ref 98–111)
Creatinine, Ser: 1.07 mg/dL (ref 0.61–1.24)
GFR, Estimated: >60 mL/min (ref >60)
Glucose, Bld: 92 mg/dL (ref 70–99)
Potassium: 4.1 mmol/L (ref 3.5–5.1)
Sodium: 138 mmol/L (ref 135–145)

## 2021-08-30 LAB — CBC
HCT: 45.4 % (ref 39.0–52.0)
Hemoglobin: 14.3 g/dL (ref 13.0–17.0)
MCH: 26.9 pg (ref 26.0–34.0)
MCHC: 31.5 g/dL (ref 30.0–36.0)
MCV: 85.3 fL (ref 80.0–100.0)
Platelets: 238 10*3/uL (ref 150–400)
RBC: 5.32 MIL/uL (ref 4.22–5.81)
RDW: 14.2 % (ref 11.5–15.5)
WBC: 2.5 10*3/uL — ABNORMAL LOW (ref 4.0–10.5)
nRBC: 0 % (ref 0.0–0.2)

## 2021-08-30 NOTE — ED Notes (Signed)
Pt ambulating in NAD, refused wheelchair...went back to parking lot when called for triage.

## 2021-08-30 NOTE — ED Triage Notes (Signed)
Pt c/o numbness/tingling to left UE started yesterday-admits to "little bit of chest pain"-denies CP at present-denies UE injury-NAD-steady gait

## 2021-08-30 NOTE — ED Notes (Signed)
Pt didn't answered when called to recheck vitals  

## 2021-08-30 NOTE — Discharge Instructions (Addendum)
You were seen in the emergency department today with left arm numbness.  Your MRI was reassuring today.  The cause of your symptoms was not identified.  Please follow up with your family doctor for recheck.

## 2021-08-30 NOTE — ED Provider Notes (Signed)
Emergency Department Provider Note   I have reviewed the triage vital signs and the nursing notes.   HISTORY  Chief Complaint Numbness and Chest Pain   HPI Sean Baker is a 60 y.o. male with past medical history reviewed below presents to the emergency department for evaluation of left arm numbness starting yesterday.  He had isolated pain in his arm with pins/needles sensation in the hand.  He is not having pain into the neck.  He denies any pain in the left arm or injury.  Denies headache, vision change, speech changes.  No symptoms into the left leg.  No associated weakness or coordination issues.  He did have momentary, sharp chest pain yesterday as well as today but has had a total of 2 episodes.  No current chest pain or pressure.  No shortness of breath.  No cough or URI symptoms.    Past Medical History:  Diagnosis Date   Asthma    Depression    Thyroid disease     Patient Active Problem List   Diagnosis Date Noted   Mild intermittent asthma without complication 11/28/2016    Past Surgical History:  Procedure Laterality Date   FOOT SURGERY  1994   NOSE SURGERY  1981    Allergies Tamsulosin hcl and Tuberculin  Family History  Problem Relation Age of Onset   Heart attack Father        Occurred when 36yrs old. Minor    Social History Social History   Tobacco Use   Smoking status: Every Day    Types: Cigars   Smokeless tobacco: Never  Vaping Use   Vaping Use: Never used  Substance Use Topics   Alcohol use: Yes    Comment: occ   Drug use: No    Review of Systems  Constitutional: No fever/chills Eyes: No visual changes. ENT: No sore throat. Cardiovascular: Momentary chest pain. Respiratory: Denies shortness of breath. Gastrointestinal: No abdominal pain.  No nausea, no vomiting.  No diarrhea.  No constipation. Genitourinary: Negative for dysuria. Musculoskeletal: Negative for back pain. Skin: Negative for rash. Neurological: Negative for  headaches or weakness. Left arm numbness.   10-point ROS otherwise negative.  ____________________________________________   PHYSICAL EXAM:  VITAL SIGNS: ED Triage Vitals  Enc Vitals Group     BP 08/30/21 1226 131/83     Pulse Rate 08/30/21 1226 (!) 56     Resp 08/30/21 1226 18     Temp 08/30/21 1226 98 F (36.7 C)     Temp Source 08/30/21 1226 Oral     SpO2 08/30/21 1226 99 %     Weight 08/30/21 1228 163 lb (73.9 kg)     Height 08/30/21 1228 5\' 10"  (1.778 m)    Constitutional: Alert and oriented. Well appearing and in no acute distress. Eyes: Conjunctivae are normal. Head: Atraumatic. Nose: No congestion/rhinnorhea. Mouth/Throat: Mucous membranes are moist.   Neck: No stridor.  Cardiovascular: Normal rate, regular rhythm. Good peripheral circulation. Grossly normal heart sounds.   Respiratory: Normal respiratory effort.  No retractions. Lungs CTAB. Gastrointestinal: Soft and nontender. No distention.  Musculoskeletal: No lower extremity tenderness nor edema. No gross deformities of extremities. Neurologic:  Normal speech and language.  No facial asymmetry.  Equal sensation to the bilateral face.  5/5 strength in the bilateral upper and lower extremities with normal coordination.  Decree sensation to light touch throughout the left upper extremity in comparison to the right.  Normal sensation in the B/L LEs.  Skin:  Skin is warm, dry and intact. No rash noted.  ____________________________________________   LABS (all labs ordered are listed, but only abnormal results are displayed)  Labs Reviewed  CBC - Abnormal; Notable for the following components:      Result Value   WBC 2.5 (*)    All other components within normal limits  BASIC METABOLIC PANEL  TROPONIN I (HIGH SENSITIVITY)  TROPONIN I (HIGH SENSITIVITY)   ____________________________________________  EKG   EKG Interpretation  Date/Time:  Friday August 30 2021 12:31:11 EDT Ventricular Rate:  57 PR  Interval:  166 QRS Duration: 106 QT Interval:  404 QTC Calculation: 393 R Axis:   -38 Text Interpretation: Sinus bradycardia Left axis deviation Abnormal ECG Confirmed by Alona Bene [phone]) on 08/30/2021 12:46:21 PM        ____________________________________________  RADIOLOGY  DG Chest 2 View  Result Date: 08/30/2021 CLINICAL DATA:  60 year old male with history of chest pain. EXAM: CHEST - 2 VIEW COMPARISON:  08/31/2016 FINDINGS: The mediastinal contours are within normal limits. No cardiomegaly. The lungs are clear bilaterally without evidence of focal consolidation, pleural effusion, or pneumothorax. No acute osseous abnormality. IMPRESSION: No acute cardiopulmonary process. Electronically Signed   By: Marliss Coots M.D.   On: 08/30/2021 12:48   CT HEAD WO CONTRAST ( )  Result Date: 08/30/2021 CLINICAL DATA:  Numbness and tingling to left upper extremity, TIA, no known trauma EXAM: CT HEAD WITHOUT CONTRAST CT CERVICAL SPINE WITHOUT CONTRAST TECHNIQUE: Multidetector CT imaging of the head and cervical spine was performed following the standard protocol without intravenous contrast. Multiplanar CT image reconstructions of the cervical spine were also generated. COMPARISON:  None. FINDINGS: CT HEAD FINDINGS Brain: No evidence of acute infarction, hemorrhage, hydrocephalus, extra-axial collection or mass lesion/mass effect. Mild periventricular white matter hypodensity. Vascular: No hyperdense vessel or unexpected calcification. Skull: Normal. Negative for fracture or focal lesion. Sinuses/Orbits: No acute finding. Other: None. CT CERVICAL SPINE FINDINGS Alignment: Degenerative straightening of the normal cervical spine. Skull base and vertebrae: No acute fracture. No primary bone lesion or focal pathologic process. Soft tissues and spinal canal: No prevertebral fluid or swelling. No visible canal hematoma. Disc levels: Moderate disc space height loss and osteophytosis C2 through C5. Upper  chest: Negative. Other: None. IMPRESSION: 1. No acute intracranial pathology. Small-vessel white matter disease. 2. No fracture or static subluxation of the cervical spine. 3. Moderate cervical disc degenerative disease. Electronically Signed   By: Jearld Lesch M.D.   On: 08/30/2021 13:58   CT Cervical Spine Wo Contrast  Result Date: 08/30/2021 CLINICAL DATA:  Numbness and tingling to left upper extremity, TIA, no known trauma EXAM: CT HEAD WITHOUT CONTRAST CT CERVICAL SPINE WITHOUT CONTRAST TECHNIQUE: Multidetector CT imaging of the head and cervical spine was performed following the standard protocol without intravenous contrast. Multiplanar CT image reconstructions of the cervical spine were also generated. COMPARISON:  None. FINDINGS: CT HEAD FINDINGS Brain: No evidence of acute infarction, hemorrhage, hydrocephalus, extra-axial collection or mass lesion/mass effect. Mild periventricular white matter hypodensity. Vascular: No hyperdense vessel or unexpected calcification. Skull: Normal. Negative for fracture or focal lesion. Sinuses/Orbits: No acute finding. Other: None. CT CERVICAL SPINE FINDINGS Alignment: Degenerative straightening of the normal cervical spine. Skull base and vertebrae: No acute fracture. No primary bone lesion or focal pathologic process. Soft tissues and spinal canal: No prevertebral fluid or swelling. No visible canal hematoma. Disc levels: Moderate disc space height loss and osteophytosis C2 through C5. Upper chest: Negative. Other: None. IMPRESSION: 1. No  acute intracranial pathology. Small-vessel white matter disease. 2. No fracture or static subluxation of the cervical spine. 3. Moderate cervical disc degenerative disease. Electronically Signed   By: Jearld Lesch M.D.   On: 08/30/2021 13:58    ____________________________________________   PROCEDURES  Procedure(s) performed:   Procedures  None  ____________________________________________   INITIAL IMPRESSION /  ASSESSMENT AND PLAN / ED COURSE  Pertinent labs & imaging results that were available during my care of the patient were reviewed by me and considered in my medical decision making (see chart for details).   Patient presents emergency department for evaluation of left arm numbness.  He had some very atypical, momentary chest discomfort which has resolved.  Exceedingly low suspicion for ACS or PE.  EKG interpreted by me as above.  Obtained and within normal limits.  No electrolyte disturbance on labs.  CT imaging of the head along with cervical spine reviewed with some degenerative changes but no acute findings such as visible ischemic stroke or bleed.  No C-spine fracture.  Chest x-ray similarly unremarkable.   Question remains of possible stroke versus spine stenosis/radicular disease.  Feel the patient would benefit from urgent MRI. Will consider transfer for additional imaging not available at this facility.   02:20 PM  Patient CT with no acute findings.  Continues to have some numb sensation.  No headache to strongly suspect complex migraine.  Radicular symptoms are a consideration.  Feel that patient would benefit from MRI of the head and cervical spine.  Spoke with Dr. Rush Landmark at the [facility] emergency department.  Patient will be transferred there for MRI.  The patient's wife is driving to the emergency department now.  I feel he will be safe for POV transport.   ____________________________________________  FINAL CLINICAL IMPRESSION(S) / ED DIAGNOSES  Final diagnoses:  Left arm numbness  Atypical chest pain     Note:  This document was prepared using Dragon voice recognition software and may include unintentional dictation errors.  Alona Bene, MD, [facility] Emergency Medicine    Carmaleta Youngers, Arlyss Repress, MD 08/30/21 [phone]

## 2021-08-31 NOTE — ED Notes (Signed)
Patient verbalizes understanding of discharge instructions. Follow-up care reviewed. Work excuses provided to pt and pt family member. Opportunity for questioning and answers were provided. Armband removed by staff, pt discharged from ED ambulatory.

## 2021-08-31 NOTE — ED Provider Notes (Signed)
Pt here with LUE paresthesia, transferred from [facility] for MRI.    MRI is reasssuring.  No evidence of acute CVA or cord compression. Plan to discharge home with outpatient resources and return precautions.   Tilden Fossa, MD 08/31/21 [phone]

## 2021-09-03 DIAGNOSIS — N4601 Organic azoospermia: Secondary | ICD-10-CM | POA: Diagnosis not present

## 2021-09-05 DIAGNOSIS — M542 Cervicalgia: Secondary | ICD-10-CM | POA: Diagnosis not present

## 2021-09-05 DIAGNOSIS — M545 Low back pain, unspecified: Secondary | ICD-10-CM | POA: Diagnosis not present

## 2021-09-05 DIAGNOSIS — M4722 Other spondylosis with radiculopathy, cervical region: Secondary | ICD-10-CM | POA: Diagnosis not present

## 2021-09-16 DIAGNOSIS — M542 Cervicalgia: Secondary | ICD-10-CM | POA: Diagnosis not present

## 2021-09-16 DIAGNOSIS — M4722 Other spondylosis with radiculopathy, cervical region: Secondary | ICD-10-CM | POA: Diagnosis not present

## 2021-09-25 DIAGNOSIS — M5412 Radiculopathy, cervical region: Secondary | ICD-10-CM | POA: Diagnosis not present

## 2022-02-08 DIAGNOSIS — F900 Attention-deficit hyperactivity disorder, predominantly inattentive type: Secondary | ICD-10-CM | POA: Diagnosis not present

## 2022-02-08 DIAGNOSIS — F422 Mixed obsessional thoughts and acts: Secondary | ICD-10-CM | POA: Diagnosis not present

## 2022-02-11 DIAGNOSIS — M542 Cervicalgia: Secondary | ICD-10-CM | POA: Diagnosis not present

## 2022-02-11 DIAGNOSIS — M791 Myalgia, unspecified site: Secondary | ICD-10-CM | POA: Diagnosis not present

## 2022-02-11 DIAGNOSIS — M5412 Radiculopathy, cervical region: Secondary | ICD-10-CM | POA: Diagnosis not present

## 2022-03-04 DIAGNOSIS — M5412 Radiculopathy, cervical region: Secondary | ICD-10-CM | POA: Diagnosis not present

## 2022-03-04 DIAGNOSIS — M7918 Myalgia, other site: Secondary | ICD-10-CM | POA: Diagnosis not present

## 2022-04-02 DIAGNOSIS — M5412 Radiculopathy, cervical region: Secondary | ICD-10-CM | POA: Diagnosis not present

## 2022-05-05 DIAGNOSIS — E039 Hypothyroidism, unspecified: Secondary | ICD-10-CM | POA: Diagnosis not present

## 2022-05-05 DIAGNOSIS — R0683 Snoring: Secondary | ICD-10-CM | POA: Diagnosis not present

## 2022-05-29 DIAGNOSIS — G4710 Hypersomnia, unspecified: Secondary | ICD-10-CM | POA: Diagnosis not present

## 2022-06-26 DIAGNOSIS — N401 Enlarged prostate with lower urinary tract symptoms: Secondary | ICD-10-CM | POA: Diagnosis not present

## 2022-06-26 DIAGNOSIS — R3912 Poor urinary stream: Secondary | ICD-10-CM | POA: Diagnosis not present

## 2022-07-26 ENCOUNTER — Ambulatory Visit
Admission: EM | Admit: 2022-07-26 | Discharge: 2022-07-26 | Disposition: A | Discharge status: Home / Self Care | Payer: Federal, State, Local not specified - PPO | Attending: Family Medicine | Admitting: Family Medicine

## 2022-07-26 ENCOUNTER — Encounter: Payer: Self-pay | Admitting: Emergency Medicine

## 2022-07-26 ENCOUNTER — Ambulatory Visit (INDEPENDENT_AMBULATORY_CARE_PROVIDER_SITE_OTHER): Payer: Federal, State, Local not specified - PPO

## 2022-07-26 DIAGNOSIS — S6991XA Unspecified injury of right wrist, hand and finger(s), initial encounter: Secondary | ICD-10-CM

## 2022-07-26 DIAGNOSIS — S63621A Sprain of interphalangeal joint of right thumb, initial encounter: Secondary | ICD-10-CM | POA: Diagnosis not present

## 2022-07-26 DIAGNOSIS — M79644 Pain in right finger(s): Secondary | ICD-10-CM | POA: Diagnosis not present

## 2022-07-26 NOTE — Discharge Instructions (Signed)
Use ice to reduce pain and swelling Take ibuprofen as needed for pain.  You may take 600 to 800 mg 3 times a day with food Return as needed

## 2022-07-26 NOTE — ED Triage Notes (Signed)
Pt states yesterday he jammed his right thumb on steering wheel of his car. He is having pain and swelling. He is taking naproxen but still hurts to move his thumb.

## 2022-07-26 NOTE — ED Provider Notes (Signed)
[facility]    CSN: [id number] Arrival date & time: 07/26/22  1018      History   Chief Complaint Chief Complaint  Patient presents with   Finger Injury    HPI Sean Baker is a 61 y.o. male.   HPI  Patient jammed his thumb yesterday trying to show an insect out of his car.  It is swollen, painful, and bruised.  Past Medical History:  Diagnosis Date   Asthma    Depression    Thyroid disease     Patient Active Problem List   Diagnosis Date Noted   Mild intermittent asthma without complication 11/28/2016    Past Surgical History:  Procedure Laterality Date   FOOT SURGERY  1994   NOSE SURGERY  1981       Home Medications    Prior to Admission medications   Medication Sig Start Date End Date Taking? Authorizing Provider  ammonium lactate (LAC-HYDRIN) 12 % lotion Apply topically. 09/27/20  Yes [provider]  sildenafil (VIAGRA) 100 MG tablet Take by mouth. 04/17/21  Yes [provider]  albuterol (PROVENTIL HFA;VENTOLIN HFA) 108 (90 BASE) MCG/ACT inhaler Inhale into the lungs every 6 (six) hours as needed for wheezing or shortness of breath.    [provider]  ALPRAZolam Prudy Feeler) 0.5 MG tablet Take 0.25-0.5 mg by mouth daily as needed. 07/01/22   [provider]  amphetamine-dextroamphetamine (ADDERALL) 10 MG tablet Take 10 mg by mouth daily with breakfast.    [provider]  Ascorbic Acid (VITA-C PO) Take by mouth daily.    [provider]  betamethasone dipropionate 0.05 % lotion Apply topically as needed.    [provider]  COD LIVER OIL PO Take by mouth daily.    [provider]  FLUoxetine (PROZAC) 20 MG capsule Take by mouth. 03/22/22   [provider]  fluticasone furoate-vilanterol (BREO ELLIPTA) 100-25 MCG/INH AEPB Inhale 1 puff into the lungs daily. 04/23/17   Mannam, Colbert Coyer, MD  levothyroxine (SYNTHROID, LEVOTHROID) 88 MCG tablet Take 88 mcg by mouth daily  before breakfast.    [provider]  Multiple Vitamin (MULTIVITAMIN) tablet Take 1 tablet by mouth daily.    [provider]  pantoprazole (PROTONIX) 20 MG tablet Take 20 mg by mouth daily.    [provider]  silodosin (RAPAFLO) 8 MG CAPS capsule Take 8 mg by mouth daily. 07/24/22   [provider]  simvastatin (ZOCOR) 20 MG tablet Take 20 mg by mouth daily.    [provider]    Family History Family History  Problem Relation Age of Onset   Heart attack Father        Occurred when 58yrs old. Minor    Social History Social History   Tobacco Use   Smoking status: Every Day    Types: Cigars   Smokeless tobacco: Never  Vaping Use   Vaping Use: Never used  Substance Use Topics   Alcohol use: Yes    Comment: occ   Drug use: No     Allergies   Tamsulosin hcl and Tuberculin, ppd   Review of Systems Review of Systems See HPI  Physical Exam Triage Vital Signs ED Triage Vitals  Enc Vitals Group     BP 07/26/22 1129 122/84     Pulse Rate 07/26/22 1129 76     Resp 07/26/22 1129 16     Temp 07/26/22 1129 98.2 F (36.8 C)     Temp Source 07/26/22  1129 Oral     SpO2 07/26/22 1129 99 %     Weight --      Height --      Head Circumference --      Peak Flow --      Pain Score 07/26/22 1130 2     Pain Loc --      Pain Edu? --      Excl. in [place]? --    No data found.  Updated Vital Signs BP 122/84 (BP Location: Right Arm)   Pulse 76   Temp 98.2 F (36.8 C) (Oral)   Resp 16   SpO2 99%       Physical Exam Constitutional:      General: He is not in acute distress.    Appearance: He is well-developed.  HENT:     Head: Normocephalic and atraumatic.  Eyes:     Conjunctiva/sclera: Conjunctivae normal.     Pupils: Pupils are equal, round, and reactive to light.  Cardiovascular:     Rate and Rhythm: Normal rate.  Pulmonary:     Effort: Pulmonary effort is normal. No respiratory distress.  Abdominal:     General: There  is no distension.     Palpations: Abdomen is soft.  Musculoskeletal:        General: Swelling and tenderness present. Normal range of motion.     Cervical back: Normal range of motion.     Comments: Right thumb is examined.  There is swelling and tenderness around the DIP joint.  There is a slight subungual hematoma visible just at the cuticle.  Range of motion is full but painful.  Normal sensation at the tip  Skin:    General: Skin is warm and dry.  Neurological:     Mental Status: He is alert.      UC Treatments / Results  Labs (all labs ordered are listed, but only abnormal results are displayed) Labs Reviewed - No data to display  EKG   Radiology DG Finger Thumb Right  Result Date: 07/26/2022 CLINICAL DATA:  pain EXAM: RIGHT THUMB 2+V COMPARISON:  None Available. FINDINGS: No acute fracture or dislocation. Joint spaces and alignment are maintained. No area of erosion or osseous destruction. No unexpected radiopaque foreign body. Mild soft tissue edema. IMPRESSION: No acute fracture or dislocation. Electronically Signed   By: Valentino Saxon M.D.   On: 07/26/2022 11:57    Procedures Procedures (including critical care time)  Medications Ordered in UC Medications - No data to display  Initial Impression / Assessment and Plan / UC Course  I have reviewed the triage vital signs and the nursing notes.  Pertinent labs & imaging results that were available during my care of the patient were reviewed by me and considered in my medical decision making (see chart for details).     Final Clinical Impressions(s) / UC Diagnoses   Final diagnoses:  Sprain of interphalangeal joint of right thumb, initial encounter     Discharge Instructions      Use ice to reduce pain and swelling Take ibuprofen as needed for pain.  You may take 600 to 800 mg 3 times a day with food Return as needed   ED Prescriptions   None    PDMP not reviewed this encounter.   Raylene Everts, MD 07/26/22 1400

## 2022-07-28 DIAGNOSIS — F4322 Adjustment disorder with anxiety: Secondary | ICD-10-CM | POA: Diagnosis not present

## 2022-07-28 DIAGNOSIS — F410 Panic disorder [episodic paroxysmal anxiety] without agoraphobia: Secondary | ICD-10-CM | POA: Diagnosis not present

## 2022-07-28 DIAGNOSIS — F900 Attention-deficit hyperactivity disorder, predominantly inattentive type: Secondary | ICD-10-CM | POA: Diagnosis not present

## 2022-08-04 DIAGNOSIS — E039 Hypothyroidism, unspecified: Secondary | ICD-10-CM | POA: Diagnosis not present

## 2022-08-04 DIAGNOSIS — Z23 Encounter for immunization: Secondary | ICD-10-CM | POA: Diagnosis not present

## 2022-08-04 DIAGNOSIS — L2089 Other atopic dermatitis: Secondary | ICD-10-CM | POA: Diagnosis not present

## 2022-08-04 DIAGNOSIS — Z125 Encounter for screening for malignant neoplasm of prostate: Secondary | ICD-10-CM | POA: Diagnosis not present

## 2022-08-04 DIAGNOSIS — E7800 Pure hypercholesterolemia, unspecified: Secondary | ICD-10-CM | POA: Diagnosis not present

## 2022-08-04 DIAGNOSIS — L98491 Non-pressure chronic ulcer of skin of other sites limited to breakdown of skin: Secondary | ICD-10-CM | POA: Diagnosis not present

## 2022-08-18 DIAGNOSIS — M79644 Pain in right finger(s): Secondary | ICD-10-CM | POA: Diagnosis not present

## 2022-08-25 DIAGNOSIS — S93402A Sprain of unspecified ligament of left ankle, initial encounter: Secondary | ICD-10-CM | POA: Diagnosis not present

## 2022-08-25 DIAGNOSIS — S63619A Unspecified sprain of unspecified finger, initial encounter: Secondary | ICD-10-CM | POA: Diagnosis not present

## 2022-08-25 DIAGNOSIS — M25561 Pain in right knee: Secondary | ICD-10-CM | POA: Diagnosis not present

## 2022-10-28 DIAGNOSIS — M79644 Pain in right finger(s): Secondary | ICD-10-CM | POA: Diagnosis not present

## 2022-11-06 ENCOUNTER — Ambulatory Visit: Payer: BC Managed Care – PPO | Admitting: Podiatry

## 2022-11-06 DIAGNOSIS — M5412 Radiculopathy, cervical region: Secondary | ICD-10-CM | POA: Diagnosis not present

## 2022-11-12 ENCOUNTER — Telehealth: Payer: Self-pay | Admitting: Podiatry

## 2022-11-12 NOTE — Telephone Encounter (Signed)
Pt is not wanting to get new xrays done only coming in for orthotics. He will be there at 815

## 2022-11-13 ENCOUNTER — Encounter: Payer: Self-pay | Admitting: Podiatry

## 2022-11-13 ENCOUNTER — Ambulatory Visit: Payer: Federal, State, Local not specified - PPO | Admitting: Podiatry

## 2022-11-13 DIAGNOSIS — M778 Other enthesopathies, not elsewhere classified: Secondary | ICD-10-CM | POA: Diagnosis not present

## 2022-11-13 DIAGNOSIS — M2142 Flat foot [pes planus] (acquired), left foot: Secondary | ICD-10-CM

## 2022-11-13 DIAGNOSIS — M2141 Flat foot [pes planus] (acquired), right foot: Secondary | ICD-10-CM

## 2022-11-13 NOTE — Progress Notes (Signed)
  Subjective:  Patient ID: Jonetta Speak, male    DOB: 1960/11/23,   MRN: [id number]  Chief Complaint  Patient presents with   Foot Orthotics    Eval for new orthotics. Patient does not have any pain at this time. Patient has been casted before for orthotics.     62 y.o. male presents for bilateral foot pain and hoping to get new orthotics. Relates he has not had any new changes or new pain really just wanting to get a new set of orthotics as previous ones have done well for him. Denies any other pedal complaints. Denies n/v/f/c.   Past Medical History:  Diagnosis Date   Asthma    Depression    Thyroid disease     Objective:  Physical Exam: Vascular: DP/PT pulses 2/4 bilateral. CFT <3 seconds. Normal hair growth on digits. No edema.  Skin. No lacerations or abrasions bilateral feet.  Musculoskeletal: MMT 5/5 bilateral lower extremities in DF, PF, Inversion and Eversion. Deceased ROM in DF of ankle joint.  Pes planus noted bilateral with increased eversion of the STJ.  Neurological: Sensation intact to light touch.   Assessment:   1. Bilateral pes planus      Plan:  Patient was evaluated and treated and all questions answered. -Patient refused X-rays. Reviewed previous from two years ago showing mild pes planus bilateral and removal of bone spur from first TMTJ.  -Discussed treatement options; discussed pes planus deformity;conservative and  surgical  -Fitted for orthotics today.  Will call for pick-up when ready. -Recommend good supportive shoes -Recommend daily stretching and icing -Patient to return to office as needed or sooner if condition worsens.   Lorenda Peck, DPM

## 2022-11-18 DIAGNOSIS — T7840XA Allergy, unspecified, initial encounter: Secondary | ICD-10-CM | POA: Diagnosis not present

## 2022-11-18 DIAGNOSIS — R21 Rash and other nonspecific skin eruption: Secondary | ICD-10-CM | POA: Diagnosis not present

## 2022-11-18 DIAGNOSIS — Z79890 Hormone replacement therapy (postmenopausal): Secondary | ICD-10-CM | POA: Diagnosis not present

## 2022-11-18 DIAGNOSIS — X58XXXA Exposure to other specified factors, initial encounter: Secondary | ICD-10-CM | POA: Diagnosis not present

## 2022-11-18 DIAGNOSIS — E079 Disorder of thyroid, unspecified: Secondary | ICD-10-CM | POA: Diagnosis not present

## 2022-11-18 DIAGNOSIS — Z79899 Other long term (current) drug therapy: Secondary | ICD-10-CM | POA: Diagnosis not present

## 2022-11-18 DIAGNOSIS — M7989 Other specified soft tissue disorders: Secondary | ICD-10-CM | POA: Diagnosis not present

## 2022-12-01 DIAGNOSIS — H1032 Unspecified acute conjunctivitis, left eye: Secondary | ICD-10-CM | POA: Diagnosis not present

## 2022-12-26 ENCOUNTER — Telehealth: Payer: Self-pay | Admitting: Podiatry

## 2022-12-26 NOTE — Telephone Encounter (Signed)
Left message on vm for patient to call back, his orthotics are in    Balance is 68.62

## 2023-01-01 ENCOUNTER — Ambulatory Visit: Payer: Federal, State, Local not specified - PPO | Admitting: Podiatry

## 2023-01-29 ENCOUNTER — Ambulatory Visit: Payer: Federal, State, Local not specified - PPO | Admitting: Podiatry

## 2023-01-29 ENCOUNTER — Telehealth: Payer: Self-pay

## 2023-01-29 NOTE — Telephone Encounter (Signed)
I Called the patient  and left him a voicemail this morning to remind him that his orthotics are here at our [place] office. I will keep them here until the end of clinic tomorrow then I will take them back to the [place] office.

## 2023-02-01 DIAGNOSIS — R030 Elevated blood-pressure reading, without diagnosis of hypertension: Secondary | ICD-10-CM | POA: Diagnosis not present

## 2023-02-01 DIAGNOSIS — R21 Rash and other nonspecific skin eruption: Secondary | ICD-10-CM | POA: Diagnosis not present

## 2023-02-01 DIAGNOSIS — L309 Dermatitis, unspecified: Secondary | ICD-10-CM | POA: Diagnosis not present

## 2023-02-01 DIAGNOSIS — U071 COVID-19: Secondary | ICD-10-CM | POA: Diagnosis not present

## 2023-02-01 DIAGNOSIS — J45909 Unspecified asthma, uncomplicated: Secondary | ICD-10-CM | POA: Diagnosis not present

## 2023-02-01 DIAGNOSIS — F17290 Nicotine dependence, other tobacco product, uncomplicated: Secondary | ICD-10-CM | POA: Diagnosis not present

## 2023-02-05 DIAGNOSIS — U071 COVID-19: Secondary | ICD-10-CM | POA: Diagnosis not present

## 2023-03-05 DIAGNOSIS — N401 Enlarged prostate with lower urinary tract symptoms: Secondary | ICD-10-CM | POA: Diagnosis not present

## 2023-03-05 DIAGNOSIS — R3912 Poor urinary stream: Secondary | ICD-10-CM | POA: Diagnosis not present

## 2023-03-05 DIAGNOSIS — Z1330 Encounter for screening examination for mental health and behavioral disorders, unspecified: Secondary | ICD-10-CM | POA: Diagnosis not present

## 2023-04-04 DIAGNOSIS — F900 Attention-deficit hyperactivity disorder, predominantly inattentive type: Secondary | ICD-10-CM | POA: Diagnosis not present

## 2023-04-04 DIAGNOSIS — F422 Mixed obsessional thoughts and acts: Secondary | ICD-10-CM | POA: Diagnosis not present

## 2023-04-04 DIAGNOSIS — F410 Panic disorder [episodic paroxysmal anxiety] without agoraphobia: Secondary | ICD-10-CM | POA: Diagnosis not present

## 2023-04-04 DIAGNOSIS — F341 Dysthymic disorder: Secondary | ICD-10-CM | POA: Diagnosis not present

## 2023-04-15 DIAGNOSIS — M5412 Radiculopathy, cervical region: Secondary | ICD-10-CM | POA: Diagnosis not present

## 2023-04-15 DIAGNOSIS — G5602 Carpal tunnel syndrome, left upper limb: Secondary | ICD-10-CM | POA: Diagnosis not present

## 2023-04-15 DIAGNOSIS — M542 Cervicalgia: Secondary | ICD-10-CM | POA: Diagnosis not present

## 2023-05-05 DIAGNOSIS — M79602 Pain in left arm: Secondary | ICD-10-CM | POA: Diagnosis not present

## 2023-05-05 DIAGNOSIS — R202 Paresthesia of skin: Secondary | ICD-10-CM | POA: Diagnosis not present

## 2023-05-06 DIAGNOSIS — M5412 Radiculopathy, cervical region: Secondary | ICD-10-CM | POA: Diagnosis not present

## 2023-05-18 DIAGNOSIS — E039 Hypothyroidism, unspecified: Secondary | ICD-10-CM | POA: Diagnosis not present

## 2023-05-18 DIAGNOSIS — R5383 Other fatigue: Secondary | ICD-10-CM | POA: Diagnosis not present

## 2023-05-19 DIAGNOSIS — R5383 Other fatigue: Secondary | ICD-10-CM | POA: Diagnosis not present

## 2023-06-11 ENCOUNTER — Encounter: Payer: Self-pay | Admitting: Emergency Medicine

## 2023-06-11 ENCOUNTER — Ambulatory Visit
Admission: EM | Admit: 2023-06-11 | Discharge: 2023-06-11 | Disposition: A | Discharge status: Home / Self Care | Payer: Federal, State, Local not specified - PPO | Attending: Family Medicine | Admitting: Family Medicine

## 2023-06-11 ENCOUNTER — Other Ambulatory Visit: Payer: Self-pay

## 2023-06-11 DIAGNOSIS — R21 Rash and other nonspecific skin eruption: Secondary | ICD-10-CM

## 2023-06-11 DIAGNOSIS — L739 Follicular disorder, unspecified: Secondary | ICD-10-CM | POA: Diagnosis not present

## 2023-06-11 DIAGNOSIS — L309 Dermatitis, unspecified: Secondary | ICD-10-CM

## 2023-06-11 HISTORY — DX: Dermatitis, unspecified: L30.9

## 2023-06-11 MED ORDER — PREDNISONE 10 MG (21) PO TBPK: ORAL_TABLET | Freq: Every day | ORAL | 0 refills | Status: DC

## 2023-06-11 MED ORDER — DOXYCYCLINE HYCLATE 100 MG PO CAPS: 100.0000 mg | ORAL_CAPSULE | Freq: Two times a day (BID) | ORAL | 0 refills | Status: AC

## 2023-06-11 MED ORDER — METHYLPREDNISOLONE SODIUM SUCC 125 MG IJ SOLR
125.0000 mg | Freq: Once | INTRAMUSCULAR | Status: AC
  Administered 2023-06-11: 125 mg via INTRAMUSCULAR

## 2023-06-11 NOTE — ED Triage Notes (Addendum)
Patient reports eczema is flairing up, itching and is reportedly the worst episode he has had in a very long time.  Patient reports he is unable to get an appt with any dermatologist in a timely fashion.  Clobetasol is not helping.  Patient is scratching head to toe during intake  Patient has a bump to the right side neck.  This bump was noticed 2 weeks ago.  Patient has not used anything on this bump

## 2023-06-11 NOTE — Discharge Instructions (Addendum)
Advised patient to take medications as directed with food to completion.  Encouraged increased daily water intake while taking these medications.  Advised if symptoms worsen and/or unresolved please follow-up with PCP or Dermatology for further evaluation.  Contact information for local Dermatology clinics provided with his AVS today.

## 2023-06-11 NOTE — ED Provider Notes (Signed)
[facility]    CSN: [id number] Arrival date & time: 06/11/23  1448      History   Chief Complaint Chief Complaint  Patient presents with   Rash    HPI Sean Baker is a 62 y.o. male.   HPI Pleasant 62 year old male presents with a rash of 4 days.  Patient reports a long history of eczema; however, reports this is the worst it has been in longer time  PMH significant for asthma, depression, and thyroid disease.   Past Medical History:  Diagnosis Date   Asthma    Depression    Eczema    Thyroid disease     Patient Active Problem List   Diagnosis Date Noted   Mild intermittent asthma without complication 11/28/2016    Past Surgical History:  Procedure Laterality Date   FOOT SURGERY  1994   NOSE SURGERY  1981       Home Medications    Prior to Admission medications   Medication Sig Start Date End Date Taking? Authorizing Provider  doxycycline (VIBRAMYCIN) 100 MG capsule Take 1 capsule (100 mg total) by mouth 2 (two) times daily for 10 days. 06/11/23 06/21/23 Yes Trevor Iha, FNP  predniSONE (STERAPRED UNI-PAK 21 TAB) 10 MG (21) TBPK tablet Take by mouth daily. Take 6 tabs by mouth daily  for 2 days, then 5 tabs for 2 days, then 4 tabs for 2 days, then 3 tabs for 2 days, 2 tabs for 2 days, then 1 tab by mouth daily for 2 days 06/11/23  Yes Trevor Iha, FNP  albuterol (PROVENTIL HFA;VENTOLIN HFA) 108 (90 BASE) MCG/ACT inhaler Inhale into the lungs every 6 (six) hours as needed for wheezing or shortness of breath.    [provider]  ALPRAZolam Prudy Feeler) 0.5 MG tablet Take 0.25-0.5 mg by mouth daily as needed. 07/01/22   [provider]  ammonium lactate (LAC-HYDRIN) 12 % lotion Apply topically. 09/27/20   [provider]  amphetamine-dextroamphetamine (ADDERALL) 10 MG tablet Take 10 mg by mouth daily with breakfast.    [provider]  Ascorbic Acid (VITA-C PO) Take by mouth daily.    [provider]   betamethasone dipropionate 0.05 % lotion Apply topically as needed.    [provider]  COD LIVER OIL PO Take by mouth daily.    [provider]  FLUoxetine (PROZAC) 20 MG capsule Take by mouth. 03/22/22   [provider]  fluticasone furoate-vilanterol (BREO ELLIPTA) 100-25 MCG/INH AEPB Inhale 1 puff into the lungs daily. 04/23/17   Mannam, Colbert Coyer, MD  levothyroxine (SYNTHROID, LEVOTHROID) 88 MCG tablet Take 88 mcg by mouth daily before breakfast.    [provider]  Multiple Vitamin (MULTIVITAMIN) tablet Take 1 tablet by mouth daily.    [provider]  pantoprazole (PROTONIX) 20 MG tablet Take 20 mg by mouth daily. Patient not taking: Reported on 06/11/2023    [provider]  sildenafil (VIAGRA) 100 MG tablet Take by mouth. 04/17/21   [provider]  silodosin (RAPAFLO) 8 MG CAPS capsule Take 8 mg by mouth daily. 07/24/22   [provider]  simvastatin (ZOCOR) 20 MG tablet Take 20 mg by mouth daily.    [provider]    Family History Family History  Problem Relation Age of Onset   Heart attack Father        Occurred when 80yrs old. Minor    Social History Social History   Tobacco Use   Smoking status: Every  Day    Types: Cigars   Smokeless tobacco: Never  Vaping Use   Vaping status: Never Used  Substance Use Topics   Alcohol use: Yes    Comment: occ   Drug use: No     Allergies   Tamsulosin hcl and Tuberculin, ppd   Review of Systems Review of Systems  Skin:  Positive for rash.     Physical Exam Triage Vital Signs ED Triage Vitals  Encounter Vitals Group     BP      Systolic BP Percentile      Diastolic BP Percentile      Pulse      Resp      Temp      Temp src      SpO2      Weight      Height      Head Circumference      Peak Flow      Pain Score      Pain Loc      Pain Education      Exclude from Growth Chart    No data found.  Updated Vital Signs BP (!)  137/95 (BP Location: Left Arm)   Pulse 80   Temp 98.5 F (36.9 C) (Oral)   Resp 20   SpO2 97%       Physical Exam Vitals and nursing note reviewed.  Constitutional:      General: He is not in acute distress.    Appearance: Normal appearance. He is normal weight. He is not ill-appearing.  HENT:     Head: Normocephalic and atraumatic.     Mouth/Throat:     Mouth: Mucous membranes are moist.     Pharynx: Oropharynx is clear.  Eyes:     Extraocular Movements: Extraocular movements intact.     Conjunctiva/sclera: Conjunctivae normal.     Pupils: Pupils are equal, round, and reactive to light.  Cardiovascular:     Rate and Rhythm: Normal rate and regular rhythm.     Pulses: Normal pulses.     Heart sounds: Normal heart sounds.  Pulmonary:     Effort: Pulmonary effort is normal.     Breath sounds: Normal breath sounds. No wheezing, rhonchi or rales.  Musculoskeletal:        General: Normal range of motion.     Cervical back: Normal range of motion and neck supple.  Skin:    General: Skin is warm and dry.     Comments: Left lower arm/left lower leg/left foot: Pruritic mildly erythematous scaling coalesced plaques noted-please see image below  Anterior neck right-sided: Erythematous papule resembles folliculitis-please see image below  Neurological:     General: No focal deficit present.     Mental Status: He is alert and oriented to person, place, and time. Mental status is at baseline.  Psychiatric:        Mood and Affect: Mood normal.        Behavior: Behavior normal.        Thought Content: Thought content normal.           UC Treatments / Results  Labs (all labs ordered are listed, but only abnormal results are displayed) Labs Reviewed - No data to display  EKG   Radiology No results found.  Procedures Procedures (including critical care time)  Medications Ordered in UC Medications  methylPREDNISolone sodium succinate (SOLU-MEDROL) 125 mg/2 mL  injection 125 mg (125 mg Intramuscular Given 06/11/23 1530)  Initial Impression / Assessment and Plan / UC Course  I have reviewed the triage vital signs and the nursing notes.  Pertinent labs & imaging results that were available during my care of the patient were reviewed by me and considered in my medical decision making (see chart for details).     MDM: 1.  Rash and nonspecific skin eruption-IM Solu-Medrol 125 mg given once in clinic and prior to discharge; 2.  Eczema, unspecified type-so diffuse over patient's body this afternoon and Rx'd Sterapred Unipak (tapering from 60 mg to 10 mg over 10 days); 3.  Folliculitis-Rx'd Doxycycline 100 mg capsule twice daily x 10 days. Advised patient to take medications as directed with food to completion.  Encouraged increased daily water intake while taking these medications.  Advised if symptoms worsen and/or unresolved please follow-up with PCP or Dermatology for further evaluation.  Contact information for local Dermatology clinics provided with his AVS today.  Work note provided per request prior to discharge.  Patient discharged home, hemodynamically stable.  Final Clinical Impressions(s) / UC Diagnoses   Final diagnoses:  Eczema, unspecified type  Rash and nonspecific skin eruption  Folliculitis     Discharge Instructions      Advised patient to take medications as directed with food to completion.  Encouraged increased daily water intake while taking these medications.  Advised if symptoms worsen and/or unresolved please follow-up with PCP or Dermatology for further evaluation.  Contact information for local Dermatology clinics provided with his AVS today.     ED Prescriptions     Medication Sig Dispense Auth. Provider   predniSONE (STERAPRED UNI-PAK 21 TAB) 10 MG (21) TBPK tablet Take by mouth daily. Take 6 tabs by mouth daily  for 2 days, then 5 tabs for 2 days, then 4 tabs for 2 days, then 3 tabs for 2 days, 2 tabs for 2 days, then  1 tab by mouth daily for 2 days 42 tablet Trevor Iha, FNP   doxycycline (VIBRAMYCIN) 100 MG capsule Take 1 capsule (100 mg total) by mouth 2 (two) times daily for 10 days. 20 capsule Trevor Iha, FNP      PDMP not reviewed this encounter.   Trevor Iha, FNP 06/11/23 1544

## 2023-06-19 DIAGNOSIS — L2089 Other atopic dermatitis: Secondary | ICD-10-CM | POA: Diagnosis not present

## 2023-06-19 DIAGNOSIS — L298 Other pruritus: Secondary | ICD-10-CM | POA: Diagnosis not present

## 2023-07-23 DIAGNOSIS — L298 Other pruritus: Secondary | ICD-10-CM | POA: Diagnosis not present

## 2023-07-23 DIAGNOSIS — L2089 Other atopic dermatitis: Secondary | ICD-10-CM | POA: Diagnosis not present

## 2023-07-24 DIAGNOSIS — L2089 Other atopic dermatitis: Secondary | ICD-10-CM | POA: Diagnosis not present

## 2023-08-05 DIAGNOSIS — I10 Essential (primary) hypertension: Secondary | ICD-10-CM | POA: Diagnosis not present

## 2023-08-05 DIAGNOSIS — M5412 Radiculopathy, cervical region: Secondary | ICD-10-CM | POA: Diagnosis not present

## 2023-09-03 ENCOUNTER — Ambulatory Visit: Payer: Federal, State, Local not specified - PPO

## 2023-09-03 ENCOUNTER — Telehealth: Payer: Self-pay | Admitting: Podiatry

## 2023-09-03 NOTE — Telephone Encounter (Signed)
Pt called stating he was cast for orthotics and a while ago and never heard anything.  Upon checking it appeared that [facility] lvm for him to call to get appt to [facility].  Checked all the orthotics downstairs and did not see them.  I called pt and left message to let him know that the orthotics could not be located but he should still come in today to be re cast for the orthotics.

## 2023-09-03 NOTE — Progress Notes (Signed)
Patient was in today and re-scanned for lost orthotics we had for him and was never fit  Patient has Pes planus deformity with pain and over pronation  Patient will benefit from CFO's to help provide total contact to BIL MLA's helping to increase support reduce pronation and balance body weight more evenly across BIL feet helping to reduce plantar pressure and pain  Patient will be called when in  [place]

## 2023-09-11 ENCOUNTER — Telehealth: Payer: Self-pay | Admitting: Hematology

## 2023-09-11 NOTE — Telephone Encounter (Signed)
Canceled appointments per patients wife's request. Patient is working mandatory over time and is unable to come in. The patients wife states that they were prefer a Saturday or will have to wait until after December 9th on a Tuesday or Thursday. Informed Mrs.Jacobsen that we do not have any other open Saturdays clinic at the moment but will call once we have something available. Mrs.Covalt expressed understanding.

## 2023-09-12 ENCOUNTER — Encounter: Payer: Federal, State, Local not specified - PPO | Admitting: Internal Medicine

## 2023-09-12 ENCOUNTER — Inpatient Hospital Stay: Payer: Federal, State, Local not specified - PPO | Admitting: Hematology

## 2023-10-07 ENCOUNTER — Telehealth: Payer: Self-pay

## 2023-10-07 NOTE — Telephone Encounter (Signed)
CALLED TO SCHEDULE APPT FOR ORTHOTIC PICK UP

## 2023-10-15 DIAGNOSIS — L2089 Other atopic dermatitis: Secondary | ICD-10-CM | POA: Diagnosis not present

## 2023-10-15 DIAGNOSIS — L810 Postinflammatory hyperpigmentation: Secondary | ICD-10-CM | POA: Diagnosis not present

## 2023-10-16 ENCOUNTER — Other Ambulatory Visit: Payer: Self-pay | Admitting: Internal Medicine

## 2023-10-16 DIAGNOSIS — D72819 Decreased white blood cell count, unspecified: Secondary | ICD-10-CM

## 2023-10-17 ENCOUNTER — Inpatient Hospital Stay: Payer: Federal, State, Local not specified - PPO | Attending: Internal Medicine | Admitting: Internal Medicine

## 2023-10-17 ENCOUNTER — Inpatient Hospital Stay: Payer: Federal, State, Local not specified - PPO

## 2023-10-17 VITALS — BP 122/82 | HR 62 | Temp 98.1°F | Resp 20 | Wt 162.2 lb

## 2023-10-17 DIAGNOSIS — D72819 Decreased white blood cell count, unspecified: Secondary | ICD-10-CM | POA: Insufficient documentation

## 2023-10-17 DIAGNOSIS — F17290 Nicotine dependence, other tobacco product, uncomplicated: Secondary | ICD-10-CM | POA: Insufficient documentation

## 2023-10-17 DIAGNOSIS — Z79890 Hormone replacement therapy (postmenopausal): Secondary | ICD-10-CM | POA: Diagnosis not present

## 2023-10-17 DIAGNOSIS — E039 Hypothyroidism, unspecified: Secondary | ICD-10-CM | POA: Insufficient documentation

## 2023-10-17 DIAGNOSIS — L309 Dermatitis, unspecified: Secondary | ICD-10-CM | POA: Insufficient documentation

## 2023-10-17 LAB — CBC WITH DIFFERENTIAL (CANCER CENTER ONLY)
Abs Immature Granulocytes: 0.01 10*3/uL (ref 0.00–0.07)
Basophils Absolute: 0 10*3/uL (ref 0.0–0.1)
Basophils Relative: 1 %
Eosinophils Absolute: 0.1 10*3/uL (ref 0.0–0.5)
Eosinophils Relative: 5 %
HCT: 43.8 % (ref 39.0–52.0)
Hemoglobin: 14.4 g/dL (ref 13.0–17.0)
Immature Granulocytes: 0 %
Lymphocytes Relative: 41 %
Lymphs Abs: 1.1 10*3/uL (ref 0.7–4.0)
MCH: 27.3 pg (ref 26.0–34.0)
MCHC: 32.9 g/dL (ref 30.0–36.0)
MCV: 83.1 fL (ref 80.0–100.0)
Monocytes Absolute: 0.3 10*3/uL (ref 0.1–1.0)
Monocytes Relative: 10 %
Neutro Abs: 1.2 10*3/uL — ABNORMAL LOW (ref 1.7–7.7)
Neutrophils Relative %: 43 %
Platelet Count: 249 10*3/uL (ref 150–400)
RBC: 5.27 MIL/uL (ref 4.22–5.81)
RDW: 13.6 % (ref 11.5–15.5)
WBC Count: 2.6 10*3/uL — ABNORMAL LOW (ref 4.0–10.5)
nRBC: 0 % (ref 0.0–0.2)

## 2023-10-17 LAB — CMP (CANCER CENTER ONLY)
ALT: 17 U/L (ref 0–44)
AST: 22 U/L (ref 15–41)
Albumin: 4.2 g/dL (ref 3.5–5.0)
Alkaline Phosphatase: 65 U/L (ref 38–126)
Anion gap: 7 (ref 5–15)
BUN: 23 mg/dL (ref 8–23)
CO2: 26 mmol/L (ref 22–32)
Calcium: 9.6 mg/dL (ref 8.9–10.3)
Chloride: 106 mmol/L (ref 98–111)
Creatinine: 1.23 mg/dL (ref 0.61–1.24)
GFR, Estimated: >60 mL/min (ref >60)
Glucose, Bld: 92 mg/dL (ref 70–99)
Potassium: 4.1 mmol/L (ref 3.5–5.1)
Sodium: 139 mmol/L (ref 135–145)
Total Bilirubin: 0.3 mg/dL (ref <1.2)
Total Protein: 7.3 g/dL (ref 6.5–8.1)

## 2023-10-17 LAB — FERRITIN: Ferritin: 97 ng/mL (ref 24–336)

## 2023-10-17 LAB — VITAMIN B12: Vitamin B-12: 659 pg/mL (ref 180–914)

## 2023-10-17 LAB — HEPATITIS PANEL, ACUTE
HCV Ab: NONREACTIVE
Hep A IgM: NONREACTIVE
Hep B C IgM: NONREACTIVE
Hepatitis B Surface Ag: NONREACTIVE

## 2023-10-17 LAB — IRON AND IRON BINDING CAPACITY (CC-WL,HP ONLY)
Iron: 62 ug/dL (ref 45–182)
Saturation Ratios: 15 % — ABNORMAL LOW (ref 17.9–39.5)
TIBC: 409 ug/dL (ref 250–450)
UIBC: 347 ug/dL (ref 117–376)

## 2023-10-17 LAB — TSH: TSH: 2.06 u[IU]/mL (ref 0.350–4.500)

## 2023-10-17 LAB — LACTATE DEHYDROGENASE: LDH: 164 U/L (ref 98–192)

## 2023-10-17 LAB — FOLATE: Folate: 8.9 ng/mL (ref >5.9)

## 2023-10-17 NOTE — Progress Notes (Signed)
[facility] [phone]   [phone]  CONSULT NOTE  REFERRING PHYSICIAN: Dr. Johny Blamer  REASON FOR CONSULTATION:  62 years old African-Americann male with leukocytopenia  HPI Sean Baker is a 62 y.o. male with past medical history significant for asthma, depression, eczema and hypothyroidism currently on levothyroxine.  He was seen by his primary care provider for routine evaluation and management of his hypothyroidism.  During his treatment he had CBC performed on May 19, 2023 that showed total white blood count of 2.4 with absolute neutrophil count of 1000.  He had normal hemoglobin of 14.7 and hematocrit 45.3 with normal platelet count of 199,000.  He has history of leukocytopenia dating back to 2009.  He was referred to me today for evaluation and recommendation regarding his condition.  Review of previous blood work showed a total white blood count of 2.5 on August 30, 2021.  On June 01, 2017 his CBC showed total white blood count of 3.4. Discussed the use of AI scribe software for clinical note transcription with the patient, who gave verbal consent to proceed.  History of Present Illness   The 62 year old patient, with a history of hypothyroidism and dermatitis, was referred for evaluation of a persistently low white blood cell count. The patient reported feeling extremely fatigued, necessitating frequent rest and periods of lethargy. The fatigue was initially attributed to his thyroid condition, but recent tests showed normal thyroid levels. The patient denied any other symptoms or complaints, specifically denying recurrent infections or unusual bleeding.  The patient has been on levothyroxine since 1993 for hypothyroidism, with a recent dose adjustment from 88 to 75 micrograms. He also recently started a new medication, Dupixent, for his dermatitis. The patient has a history of short-term prednisone use and currently takes over-the-counter  propozole.  The patient has a significant smoking history, starting cigarettes at age 67 and recently transitioning to cigars about four years ago. He consumes one beer daily and denies any illicit drug use. The patient has known allergies to tumulosin and tuberculin.  The patient's family history is significant for diabetes and heart disease on the maternal side, and cancer on the paternal side. 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      HPI  Past Medical History:  Diagnosis Date   Asthma    Depression    Eczema    Thyroid disease     Past Surgical History:  Procedure Laterality Date   FOOT SURGERY  1994   NOSE SURGERY  1981    Family History  Problem Relation Age of Onset   Heart attack Father        Occurred when 74yrs old. Minor    Social History Social History   Tobacco Use   Smoking status: Every Day    Types: Cigars   Smokeless tobacco: Never  Vaping Use   Vaping status: Never Used  Substance Use Topics   Alcohol use: Yes    Comment: occ   Drug use: No    Allergies  Allergen Reactions   Tamsulosin Hcl    Tuberculin, Ppd     Current Outpatient Medications  Medication Sig Dispense Refill   albuterol (PROVENTIL HFA;VENTOLIN HFA) 108 (90 BASE) MCG/ACT inhaler Inhale into the lungs every 6 (six) hours as needed for wheezing or shortness of breath.     ALPRAZolam (XANAX) 0.5 MG tablet Take 0.25-0.5 mg by mouth daily as needed.     ammonium  lactate (LAC-HYDRIN) 12 % lotion Apply topically.     amphetamine-dextroamphetamine (ADDERALL) 10 MG tablet Take 10 mg by mouth daily with breakfast.     Ascorbic Acid (VITA-C PO) Take by mouth daily.     betamethasone dipropionate 0.05 % lotion Apply topically as needed.     COD LIVER OIL PO Take by mouth daily.     FLUoxetine (PROZAC) 20 MG capsule Take by mouth.     fluticasone furoate-vilanterol (BREO ELLIPTA) 100-25 MCG/INH AEPB Inhale 1 puff into the lungs daily. 60 each 5    levothyroxine (SYNTHROID, LEVOTHROID) 88 MCG tablet Take 88 mcg by mouth daily before breakfast.     Multiple Vitamin (MULTIVITAMIN) tablet Take 1 tablet by mouth daily.     pantoprazole (PROTONIX) 20 MG tablet Take 20 mg by mouth daily. (Patient not taking: Reported on 06/11/2023)     predniSONE (STERAPRED UNI-PAK 21 TAB) 10 MG (21) TBPK tablet Take by mouth daily. Take 6 tabs by mouth daily  for 2 days, then 5 tabs for 2 days, then 4 tabs for 2 days, then 3 tabs for 2 days, 2 tabs for 2 days, then 1 tab by mouth daily for 2 days 42 tablet 0   sildenafil (VIAGRA) 100 MG tablet Take by mouth.     silodosin (RAPAFLO) 8 MG CAPS capsule Take 8 mg by mouth daily.     simvastatin (ZOCOR) 20 MG tablet Take 20 mg by mouth daily.     No current facility-administered medications for this visit.    Review of Systems  Constitutional: positive for fatigue Eyes: negative Ears, nose, mouth, throat, and face: negative Respiratory: negative Cardiovascular: negative Gastrointestinal: negative Genitourinary:negative Integument/breast: negative Hematologic/lymphatic: negative Musculoskeletal:negative Neurological: negative Behavioral/Psych: negative Endocrine: negative Allergic/Immunologic: negative  Physical Exam  HQI:ONGEX, healthy, no distress, well nourished, well developed, and anxious SKIN: skin color, texture, turgor are normal, no rashes or significant lesions HEAD: Normocephalic, No masses, lesions, tenderness or abnormalities EYES: normal, PERRLA, Conjunctiva are pink and non-injected EARS: External ears normal, Canals clear OROPHARYNX:no exudate, no erythema, and lips, buccal mucosa, and tongue normal  NECK: supple, no adenopathy, no JVD LYMPH:  no palpable lymphadenopathy, no hepatosplenomegaly LUNGS: clear to auscultation , and palpation HEART: regular rate & rhythm, no murmurs, and no gallops ABDOMEN:abdomen soft, non-tender, normal bowel sounds, and no masses or  organomegaly BACK: Back symmetric, no curvature., No CVA tenderness EXTREMITIES:no joint deformities, effusion, or inflammation, no edema  NEURO: alert & oriented x 3 with fluent speech, no focal motor/sensory deficits  PERFORMANCE STATUS: ECOG 1  LABORATORY DATA: Lab Results  Component Value Date   WBC 2.5 (L) 08/30/2021   HGB 14.3 08/30/2021   HCT 45.4 08/30/2021   MCV 85.3 08/30/2021   PLT 238 08/30/2021      Chemistry      Component Value Date/Time   NA 138 08/30/2021 1251   K 4.1 08/30/2021 1251   CL 104 08/30/2021 1251   CO2 28 08/30/2021 1251   BUN 16 08/30/2021 1251   CREATININE 1.07 08/30/2021 1251      Component Value Date/Time   CALCIUM 9.2 08/30/2021 1251     LABS WBC: 2.6 (10/17/2023) Absolute neutrophil count: 1200 (10/17/2023) Hb: 14.4 (10/17/2023) Hematocrit: 43.8 (10/17/2023) WBC: 2.5 (08/30/2021) WBC: 2.4 Absolute neutrophil count: 1000  RADIOGRAPHIC STUDIES: No results found.  ASSESSMENT AND PLAN:    Leukopenia Chronic leukopenia with WBC of 2.6 and absolute neutrophil count of 1200 since 2009. No recurrent infections, bleeding, or bruising. Differential  includes autoimmune disorder, drug-induced leukopenia, or ethnic origin. Bone marrow pathology less likely but not ruled out. Bone marrow biopsy may be necessary if blood work is inconclusive. Procedure involves sedation and local anesthesia, causing pressure rather than pain. - Order comprehensive blood work including hepatitis panel, HIV, rheumatoid factor, ANA and vitamin B12 levels - Consider bone marrow biopsy if blood work is inconclusive - Follow up in one month to review lab results and discuss further steps  Hypothyroidism Managed with levothyroxine 75 mcg daily. Thyroid levels are within normal range. Fatigue unlikely related to thyroid function. - Continue levothyroxine 75 mcg daily  Dermatitis Chronic dermatitis possibly related to asthma. Managed with Dupixent. No recent  prednisone use. - Continue Dupixent - Monitor for new or worsening symptoms  General Health Maintenance 62 year old with smoking history and moderate alcohol consumption. No multivitamin use. Discussed benefits of starting a multivitamin and encouraged smoking cessation and alcohol moderation. - Recommend starting a multivitamin such as Centrum Silver - Encourage smoking cessation and alcohol moderation  Follow-up - Schedule follow-up in one month - Contact if urgent issues arise from lab results.   The patient was advised to call immediately if he has any other concerning symptoms in the interval. The patient voices understanding of current disease status and treatment options and is in agreement with the current care plan.  All questions were answered. The patient knows to call the clinic with any problems, questions or concerns. We can certainly see the patient much sooner if necessary.  Thank you so much for allowing me to participate in the care of Sean Baker. I will continue to follow up the patient with you and assist in his care.  The total time spent in the appointment was 60 minutes.  Disclaimer: This note was dictated with voice recognition software. Similar sounding words can inadvertently be transcribed and may not be corrected upon review.   Lajuana Matte October 17, 2023, 11:01 AM

## 2023-10-20 LAB — RHEUMATOID FACTOR: Rheumatoid fact SerPl-aCnc: <10 [IU]/mL (ref <14.0)

## 2023-10-22 LAB — ANTINUCLEAR ANTIBODIES, IFA: ANA Ab, IFA: NEGATIVE

## 2023-11-16 ENCOUNTER — Inpatient Hospital Stay: Payer: Federal, State, Local not specified - PPO | Admitting: Internal Medicine

## 2023-11-16 ENCOUNTER — Inpatient Hospital Stay: Payer: Federal, State, Local not specified - PPO | Attending: Internal Medicine

## 2023-11-17 DIAGNOSIS — L2089 Other atopic dermatitis: Secondary | ICD-10-CM | POA: Diagnosis not present

## 2023-11-17 DIAGNOSIS — L738 Other specified follicular disorders: Secondary | ICD-10-CM | POA: Diagnosis not present

## 2023-11-17 DIAGNOSIS — L810 Postinflammatory hyperpigmentation: Secondary | ICD-10-CM | POA: Diagnosis not present

## 2023-11-19 DIAGNOSIS — F331 Major depressive disorder, recurrent, moderate: Secondary | ICD-10-CM | POA: Diagnosis not present

## 2023-11-19 DIAGNOSIS — F422 Mixed obsessional thoughts and acts: Secondary | ICD-10-CM | POA: Diagnosis not present

## 2023-11-19 DIAGNOSIS — F900 Attention-deficit hyperactivity disorder, predominantly inattentive type: Secondary | ICD-10-CM | POA: Diagnosis not present

## 2023-11-23 ENCOUNTER — Telehealth: Payer: Self-pay

## 2023-11-23 NOTE — Telephone Encounter (Signed)
Left pt voicemail to schedule orthotic pick up. I was returning his voicemail

## 2023-11-26 DIAGNOSIS — M67911 Unspecified disorder of synovium and tendon, right shoulder: Secondary | ICD-10-CM | POA: Diagnosis not present

## 2023-11-26 DIAGNOSIS — M7021 Olecranon bursitis, right elbow: Secondary | ICD-10-CM | POA: Diagnosis not present

## 2023-11-26 DIAGNOSIS — M79644 Pain in right finger(s): Secondary | ICD-10-CM | POA: Diagnosis not present

## 2023-11-27 ENCOUNTER — Ambulatory Visit: Payer: Federal, State, Local not specified - PPO

## 2023-11-27 NOTE — Progress Notes (Signed)
Patient presents today to pick up custom molded foot orthotics, diagnosed with pes planus by Dr. Ralene Cork.   Orthotics were dispensed and fit was satisfactory. Reviewed instructions for break-in and wear. Written instructions given to patient.  Patient will follow up as needed.  Addison Bailey CPed, CFo, CFm

## 2023-12-10 DIAGNOSIS — I10 Essential (primary) hypertension: Secondary | ICD-10-CM | POA: Diagnosis not present

## 2023-12-10 DIAGNOSIS — F900 Attention-deficit hyperactivity disorder, predominantly inattentive type: Secondary | ICD-10-CM | POA: Diagnosis not present

## 2023-12-10 DIAGNOSIS — Z13220 Encounter for screening for lipoid disorders: Secondary | ICD-10-CM | POA: Diagnosis not present

## 2023-12-10 DIAGNOSIS — F331 Major depressive disorder, recurrent, moderate: Secondary | ICD-10-CM | POA: Diagnosis not present

## 2023-12-10 DIAGNOSIS — Z125 Encounter for screening for malignant neoplasm of prostate: Secondary | ICD-10-CM | POA: Diagnosis not present

## 2024-02-02 ENCOUNTER — Telehealth: Payer: Self-pay

## 2024-02-02 NOTE — Telephone Encounter (Signed)
 Orthotics we thought were lost were found I called and LM for patient we he can pick up but asked him to [facility] and let us know if he wants to PU in [facility] office or [place]  Orthotics are here in [place] but can give to [place] or Dr Ralene Cork to take this week to [facility]  No charge  Addison Bailey CPed, CFo, CFm

## 2024-02-11 ENCOUNTER — Telehealth: Payer: Self-pay | Admitting: Internal Medicine

## 2024-02-11 NOTE — Telephone Encounter (Signed)
 Scheduled appointments per the patients request. The patient will be mailed an appointment reminder.

## 2024-02-18 ENCOUNTER — Encounter: Payer: Self-pay | Admitting: Medical Oncology

## 2024-02-18 ENCOUNTER — Telehealth: Payer: Self-pay | Admitting: Medical Oncology

## 2024-02-18 DIAGNOSIS — E559 Vitamin D deficiency, unspecified: Secondary | ICD-10-CM | POA: Diagnosis not present

## 2024-02-18 DIAGNOSIS — R5383 Other fatigue: Secondary | ICD-10-CM | POA: Diagnosis not present

## 2024-02-18 DIAGNOSIS — R232 Flushing: Secondary | ICD-10-CM | POA: Diagnosis not present

## 2024-02-18 DIAGNOSIS — E538 Deficiency of other specified B group vitamins: Secondary | ICD-10-CM | POA: Diagnosis not present

## 2024-02-18 DIAGNOSIS — D708 Other neutropenia: Secondary | ICD-10-CM | POA: Diagnosis not present

## 2024-02-18 DIAGNOSIS — R1110 Vomiting, unspecified: Secondary | ICD-10-CM | POA: Diagnosis not present

## 2024-02-18 NOTE — Telephone Encounter (Signed)
 Letter sent to my chart

## 2024-02-18 NOTE — Telephone Encounter (Signed)
 Wife brought pt here because of recent symptoms, weak, almost passed out at work today and EMS came to his work .   The pt is  in no acute distress and he is sitting with his wife.  His wife showed me his VS from EMS . His BP was elevated . The pt then remembered that Dr Cleora Daft started a new BP medication . I told pt and wife to have Dr Cleora Daft fax recent labs.

## 2024-03-09 DIAGNOSIS — R768 Other specified abnormal immunological findings in serum: Secondary | ICD-10-CM | POA: Diagnosis not present

## 2024-03-15 DIAGNOSIS — F411 Generalized anxiety disorder: Secondary | ICD-10-CM | POA: Diagnosis not present

## 2024-03-15 DIAGNOSIS — F329 Major depressive disorder, single episode, unspecified: Secondary | ICD-10-CM | POA: Diagnosis not present

## 2024-03-16 ENCOUNTER — Inpatient Hospital Stay: Attending: Internal Medicine

## 2024-03-16 ENCOUNTER — Inpatient Hospital Stay (HOSPITAL_BASED_OUTPATIENT_CLINIC_OR_DEPARTMENT_OTHER): Admitting: Internal Medicine

## 2024-03-16 VITALS — BP 109/79 | HR 63 | Temp 97.9°F | Resp 18 | Ht 70.0 in | Wt 157.2 lb

## 2024-03-16 DIAGNOSIS — D72819 Decreased white blood cell count, unspecified: Secondary | ICD-10-CM | POA: Diagnosis not present

## 2024-03-16 LAB — CBC WITH DIFFERENTIAL (CANCER CENTER ONLY)
Abs Immature Granulocytes: 0.01 10*3/uL (ref 0.00–0.07)
Basophils Absolute: 0 10*3/uL (ref 0.0–0.1)
Basophils Relative: 1 %
Eosinophils Absolute: 0 10*3/uL (ref 0.0–0.5)
Eosinophils Relative: 1 %
HCT: 42.1 % (ref 39.0–52.0)
Hemoglobin: 14 g/dL (ref 13.0–17.0)
Immature Granulocytes: 0 %
Lymphocytes Relative: 41 %
Lymphs Abs: 1 10*3/uL (ref 0.7–4.0)
MCH: 27.1 pg (ref 26.0–34.0)
MCHC: 33.3 g/dL (ref 30.0–36.0)
MCV: 81.6 fL (ref 80.0–100.0)
Monocytes Absolute: 0.2 10*3/uL (ref 0.1–1.0)
Monocytes Relative: 7 %
Neutro Abs: 1.2 10*3/uL — ABNORMAL LOW (ref 1.7–7.7)
Neutrophils Relative %: 50 %
Platelet Count: 269 10*3/uL (ref 150–400)
RBC: 5.16 MIL/uL (ref 4.22–5.81)
RDW: 14.6 % (ref 11.5–15.5)
WBC Count: 2.4 10*3/uL — ABNORMAL LOW (ref 4.0–10.5)
nRBC: 0 % (ref 0.0–0.2)

## 2024-03-16 LAB — LACTATE DEHYDROGENASE: LDH: 162 U/L (ref 98–192)

## 2024-03-16 NOTE — Progress Notes (Signed)
 [facility] [phone]   [phone]  OFFICE PROGRESS NOTE  Sean Congo, MD [phone] [address] [place] [address]  DIAGNOSIS: Chronic leukopenia with WBC of 2.6 and absolute neutrophil count of 1200 since 2009. No recurrent infections, bleeding, or bruising. Differential includes autoimmune disorder, drug-induced leukopenia, or ethnic origin.   PRIOR THERAPY: None  CURRENT THERAPY: Observation  INTERVAL HISTORY: Sean Baker 63 y.o. male returns to the clinic today for follow-up visit accompanied by his wife.Discussed the use of AI scribe software for clinical note transcription with the patient, who gave verbal consent to proceed.  History of Present Illness   Sean Baker is a 63 year old male with chronic leukocytopenia who presents with episodes of fatigue and weakness. He is accompanied by his wife.  He has a history of chronic leukocytopenia since 2009, with consistently low white blood cell counts ranging from 2.4 to 2.6 and an absolute neutrophil count of 1200. Despite these findings, no frequent infections have been reported.  He experiences episodes of fatigue and weakness. A notable episode occurred on Easter, characterized by feeling hot, weak, and needing to lie down. He regurgitated multiple times and was unable to work the following day. Another episode occurred at work, where he felt sick and weak, leading his supervisor to call EMS. He regurgitated in the bathroom and felt very weak and tired. He did not go to the emergency room but visited his primary care physician, who conducted extensive blood work.  His past medical workup includes a negative ANA and rheumatoid factor test in December, along with normal B12, iron, and folate levels. Recent blood work showed a positive ANA test. Family history reveals that his mother had leukemia, and lupus runs in his paternal side of the family.  Socially, he consumes a  diet high in sugar, including [employer] donuts and cheesecake, and does not engage in regular physical activity despite having gym memberships.       MEDICAL HISTORY: Past Medical History:  Diagnosis Date   Asthma    Depression    Eczema    Thyroid  disease     ALLERGIES:  is allergic to tamsulosin hcl and tuberculin, ppd.  MEDICATIONS:  Current Outpatient Medications  Medication Sig Dispense Refill   albuterol  (PROVENTIL  HFA;VENTOLIN  HFA) 108 (90 BASE) MCG/ACT inhaler Inhale into the lungs every 6 (six) hours as needed for wheezing or shortness of breath.     ALPRAZolam (XANAX) 0.5 MG tablet Take 0.25-0.5 mg by mouth daily as needed.     ammonium lactate (LAC-HYDRIN) 12 % lotion Apply topically.     amphetamine-dextroamphetamine (ADDERALL) 10 MG tablet Take 10 mg by mouth daily with breakfast.     Ascorbic Acid (VITA-C PO) Take by mouth daily.     betamethasone  dipropionate 0.05 % lotion Apply topically as needed.     COD LIVER OIL PO Take by mouth daily.     FLUoxetine (PROZAC) 20 MG capsule Take by mouth.     fluticasone  furoate-vilanterol (BREO ELLIPTA ) 100-25 MCG/INH AEPB Inhale 1 puff into the lungs daily. 60 each 5   levothyroxine (SYNTHROID, LEVOTHROID) 88 MCG tablet Take 88 mcg by mouth daily before breakfast.     Multiple Vitamin (MULTIVITAMIN) tablet Take 1 tablet by mouth daily.     pantoprazole (PROTONIX) 20 MG tablet Take 20 mg by mouth daily.     predniSONE  (STERAPRED UNI-PAK 21 TAB) 10 MG (21) TBPK tablet Take by  mouth daily. Take 6 tabs by mouth daily  for 2 days, then 5 tabs for 2 days, then 4 tabs for 2 days, then 3 tabs for 2 days, 2 tabs for 2 days, then 1 tab by mouth daily for 2 days (Patient not taking: Reported on 10/17/2023) 42 tablet 0   sildenafil (VIAGRA) 100 MG tablet Take by mouth.     silodosin (RAPAFLO) 8 MG CAPS capsule Take 8 mg by mouth daily.     simvastatin (ZOCOR) 20 MG tablet Take 20 mg by mouth daily. (Patient not taking: Reported on  10/17/2023)     No current facility-administered medications for this visit.    SURGICAL HISTORY:  Past Surgical History:  Procedure Laterality Date   FOOT SURGERY  1994   NOSE SURGERY  1981    REVIEW OF SYSTEMS:  Constitutional: positive for fatigue Eyes: negative Ears, nose, mouth, throat, and face: negative Respiratory: negative Cardiovascular: negative Gastrointestinal: negative Genitourinary:negative Integument/breast: negative Hematologic/lymphatic: negative Musculoskeletal:negative Neurological: negative Behavioral/Psych: negative Endocrine: negative Allergic/Immunologic: negative   PHYSICAL EXAMINATION: General appearance: alert, cooperative, fatigued, and no distress Head: Normocephalic, without obvious abnormality, atraumatic Neck: no adenopathy, no JVD, supple, symmetrical, trachea midline, and thyroid  not enlarged, symmetric, no tenderness/mass/nodules Lymph nodes: Cervical, supraclavicular, and axillary nodes normal. Resp: clear to auscultation bilaterally Back: symmetric, no curvature. ROM normal. No CVA tenderness. Cardio: regular rate and rhythm, S1, S2 normal, no murmur, click, rub or gallop GI: soft, non-tender; bowel sounds normal; no masses,  no organomegaly Extremities: extremities normal, atraumatic, no cyanosis or edema Neurologic: Alert and oriented X 3, normal strength and tone. Normal symmetric reflexes. Normal coordination and gait  ECOG PERFORMANCE STATUS: 1 - Symptomatic but completely ambulatory  Blood pressure 109/79, pulse 63, temperature 97.9 F (36.6 C), temperature source Temporal, resp. rate 18, height 5\' 10"  (1.778 m), weight 157 lb 3.2 oz (71.3 kg), SpO2 100%.  LABORATORY DATA: Lab Results  Component Value Date   WBC 2.4 (L) 03/16/2024   HGB 14.0 03/16/2024   HCT 42.1 03/16/2024   MCV 81.6 03/16/2024   PLT 269 03/16/2024      Chemistry      Component Value Date/Time   NA 139 10/17/2023 1042   K 4.1 10/17/2023 1042   CL  106 10/17/2023 1042   CO2 26 10/17/2023 1042   BUN 23 10/17/2023 1042   CREATININE 1.23 10/17/2023 1042      Component Value Date/Time   CALCIUM 9.6 10/17/2023 1042   ALKPHOS 65 10/17/2023 1042   AST 22 10/17/2023 1042   ALT 17 10/17/2023 1042   BILITOT 0.3 10/17/2023 1042       RADIOGRAPHIC STUDIES: No results found.  ASSESSMENT AND PLAN: Assessment and Plan    Chronic leukocytopenia Chronic leukocytopenia since 2009 with white blood cell count around 2.4 to 2.6 and absolute neutrophil count at 1200. Absence of recurrent infections suggests no significant immunocompromise. Fatigue is not due to leukocytopenia as hemoglobin and red blood cell counts are normal. Differential diagnosis includes potential bone marrow pathology or ethnic variation in white blood cell count, common in African Americans. Bone marrow biopsy is recommended to rule out bone marrow pathology. - Order bone marrow biopsy to evaluate for potential bone marrow pathology. - Discuss bone marrow biopsy procedure, including mild sedation, local anesthesia, outpatient nature, and need for someone to drive him home.  Positive ANA test Recent positive ANA test suggests possible autoimmune disorder, such as lupus, which has familial prevalence. Previous ANA test in December  was negative. Further evaluation by rheumatology is necessary to confirm diagnosis and assess for other autoimmune disorders. - Refer to rheumatologist for further evaluation of positive ANA test and potential autoimmune disorder.   The patient was advised to call immediately if he has any other concerning symptoms in the interval. The patient voices understanding of current disease status and treatment options and is in agreement with the current care plan.  All questions were answered. The patient knows to call the clinic with any problems, questions or concerns. We can certainly see the patient much sooner if necessary. The total time spent in the  appointment was 30 minutes including review of chart and various tests results, discussions about plan of care and coordination of care plan .   Disclaimer: This note was dictated with voice recognition software. Similar sounding words can inadvertently be transcribed and may not be corrected upon review.

## 2024-03-18 ENCOUNTER — Telehealth: Payer: Self-pay | Admitting: Internal Medicine

## 2024-03-18 ENCOUNTER — Other Ambulatory Visit: Payer: Self-pay

## 2024-03-18 NOTE — Telephone Encounter (Signed)
 Left a voicemail with the appointment details.

## 2024-03-22 DIAGNOSIS — F329 Major depressive disorder, single episode, unspecified: Secondary | ICD-10-CM | POA: Diagnosis not present

## 2024-03-22 DIAGNOSIS — F411 Generalized anxiety disorder: Secondary | ICD-10-CM | POA: Diagnosis not present

## 2024-04-06 DIAGNOSIS — R42 Dizziness and giddiness: Secondary | ICD-10-CM | POA: Diagnosis not present

## 2024-04-11 ENCOUNTER — Other Ambulatory Visit: Payer: Self-pay | Admitting: Radiology

## 2024-04-11 DIAGNOSIS — D72819 Decreased white blood cell count, unspecified: Secondary | ICD-10-CM

## 2024-04-11 NOTE — H&P (Signed)
 Chief Complaint: Chronic leukopenia of uncertain etiology; referred for image guided bone marrow biopsy for further evaluation  Referring Provider(s): Mohamed,M  Supervising Physician: Susan Ensign  Patient Status: [facility] - Out-pt  History of Present Illness: Sean Baker is a 63 y.o. male smoker with past medical history significant for asthma, depression, eczema, hypothyroidism and chronic leukocytopenia since 2009 along with positive ANA.  He is scheduled today for image guided bone marrow biopsy for further evaluation.  *** Patient is Full Code  Past Medical History:  Diagnosis Date   Asthma    Depression    Eczema    Thyroid  disease     Past Surgical History:  Procedure Laterality Date   FOOT SURGERY  1994   NOSE SURGERY  1981    Allergies: Tamsulosin hcl and Tuberculin, ppd  Medications: Prior to Admission medications   Medication Sig Start Date End Date Taking? Authorizing Provider  albuterol  (PROVENTIL  HFA;VENTOLIN  HFA) 108 (90 BASE) MCG/ACT inhaler Inhale into the lungs every 6 (six) hours as needed for wheezing or shortness of breath.    [provider]  ALPRAZolam (XANAX) 0.5 MG tablet Take 0.25-0.5 mg by mouth daily as needed. 07/01/22   [provider]  ammonium lactate (LAC-HYDRIN) 12 % lotion Apply topically. 09/27/20   [provider]  amphetamine-dextroamphetamine (ADDERALL) 10 MG tablet Take 10 mg by mouth daily with breakfast.    [provider]  Ascorbic Acid (VITA-C PO) Take by mouth daily.    [provider]  betamethasone  dipropionate 0.05 % lotion Apply topically as needed.    [provider]  COD LIVER OIL PO Take by mouth daily.    [provider]  FLUoxetine (PROZAC) 20 MG capsule Take by mouth. 03/22/22   [provider]  fluticasone  furoate-vilanterol (BREO ELLIPTA ) 100-25 MCG/INH AEPB Inhale 1 puff into the lungs daily. 04/23/17   Mannam, Praveen, MD  levothyroxine  (SYNTHROID, LEVOTHROID) 88 MCG tablet Take 88 mcg by mouth daily before breakfast.    [provider]  Multiple Vitamin (MULTIVITAMIN) tablet Take 1 tablet by mouth daily.    [provider]  pantoprazole (PROTONIX) 20 MG tablet Take 20 mg by mouth daily.    [provider]  predniSONE  (STERAPRED UNI-PAK 21 TAB) 10 MG (21) TBPK tablet Take by mouth daily. Take 6 tabs by mouth daily  for 2 days, then 5 tabs for 2 days, then 4 tabs for 2 days, then 3 tabs for 2 days, 2 tabs for 2 days, then 1 tab by mouth daily for 2 days Patient not taking: Reported on 10/17/2023 06/11/23   Leonides Ramp, FNP  sildenafil (VIAGRA) 100 MG tablet Take by mouth. 04/17/21   [provider]  silodosin (RAPAFLO) 8 MG CAPS capsule Take 8 mg by mouth daily. 07/24/22   [provider]  simvastatin (ZOCOR) 20 MG tablet Take 20 mg by mouth daily. Patient not taking: Reported on 10/17/2023    [provider]     Family History  Problem Relation Age of Onset   Heart attack Father        Occurred when 26yrs old. Minor    Social History   Socioeconomic History   Marital status: Married    Spouse name: Not on file   Number of children: Not on file   Years of education: Not on file   Highest education level: Not on file  Occupational History   Not on file  Tobacco Use   Smoking  status: Every Day    Types: Cigars   Smokeless tobacco: Never  Vaping Use   Vaping status: Never Used  Substance and Sexual Activity   Alcohol use: Yes    Comment: occ   Drug use: No   Sexual activity: Not on file  Other Topics Concern   Not on file  Social History Narrative   Married lives with spouse. Has 5 children. Works as Retail banker.    Social Drivers of Corporate investment banker Strain: Low Risk  (12/10/2023)   Received from [facility]   Overall Financial Resource Strain (CARDIA)    Difficulty of Paying Living Expenses: Not hard at all  Food Insecurity: No  Food Insecurity (12/10/2023)   Received from [facility]   Hunger Vital Sign    Within the past 12 months, you worried that your food would run out before you got the money to buy more.: Never true    Within the past 12 months, the food you bought just didn't last and you didn't have money to get more.: Never true  Transportation Needs: No Transportation Needs (12/10/2023)   Received from [facility] - Transportation    Lack of Transportation (Medical): No    Lack of Transportation (Non-Medical): No  Physical Activity: Not on file  Stress: Stress Concern Present (05/17/2021)   Received from [facility] - Occupational Stress Questionnaire    Feeling of Stress : Rather much  Social Connections: Unknown (03/10/2022)   Received from [facility]   Social Network    Social Network: Not on file       Review of Systems  Vital Signs:   Advance Care Plan: no documents on file    Physical Exam  Imaging: No results found.  Labs:  CBC: Recent Labs    10/17/23 1042 03/16/24 1512  WBC 2.6* 2.4*  HGB 14.4 14.0  HCT 43.8 42.1  PLT 249 269    COAGS: No results for input(s): INR, APTT in the last 8760 hours.  BMP: Recent Labs    10/17/23 1042  NA 139  K 4.1  CL 106  CO2 26  GLUCOSE 92  BUN 23  CALCIUM 9.6  CREATININE 1.23  GFRNONAA >60    LIVER FUNCTION TESTS: Recent Labs    10/17/23 1042  BILITOT 0.3  AST 22  ALT 17  ALKPHOS 65  PROT 7.3  ALBUMIN 4.2    TUMOR MARKERS: No results for input(s): AFPTM, CEA, CA199, CHROMGRNA in the last 8760 hours.  Assessment and Plan: 63 y.o. male smoker with past medical history significant for asthma, depression, eczema, hypothyroidism and chronic leukocytopenia since 2009 along with positive ANA.  He is scheduled today for image guided bone marrow biopsy for further evaluation.Risks and benefits of procedure was discussed with the patient   including, but not limited to bleeding, infection, damage to adjacent structures or low yield requiring additional tests.  All of the questions were answered and there is agreement to proceed.  Consent signed and in chart.    Thank you for allowing our service to participate in Neamiah Sciarra 's care.  Electronically Signed: D. Honore Lux, PA-C   04/11/2024, 1:53 PM      I spent a total of 20 minutes    in face to face in clinical consultation, greater than 50% of which was counseling/coordinating care for image guided bone marrow biopsy

## 2024-04-12 ENCOUNTER — Ambulatory Visit (HOSPITAL_COMMUNITY)
Admission: RE | Admit: 2024-04-12 | Discharge: 2024-04-12 | Disposition: A | Discharge status: Home / Self Care | Source: Ambulatory Visit | Attending: Cardiology | Admitting: Cardiology

## 2024-04-12 ENCOUNTER — Encounter (HOSPITAL_COMMUNITY): Payer: Self-pay

## 2024-04-12 ENCOUNTER — Ambulatory Visit (HOSPITAL_COMMUNITY)
Admission: RE | Admit: 2024-04-12 | Discharge: 2024-04-12 | Disposition: A | Discharge status: Home / Self Care | Source: Ambulatory Visit | Attending: Internal Medicine | Admitting: Internal Medicine

## 2024-04-12 DIAGNOSIS — L309 Dermatitis, unspecified: Secondary | ICD-10-CM | POA: Insufficient documentation

## 2024-04-12 DIAGNOSIS — F329 Major depressive disorder, single episode, unspecified: Secondary | ICD-10-CM | POA: Diagnosis not present

## 2024-04-12 DIAGNOSIS — Z79890 Hormone replacement therapy (postmenopausal): Secondary | ICD-10-CM | POA: Insufficient documentation

## 2024-04-12 DIAGNOSIS — Z1379 Encounter for other screening for genetic and chromosomal anomalies: Secondary | ICD-10-CM | POA: Insufficient documentation

## 2024-04-12 DIAGNOSIS — J45909 Unspecified asthma, uncomplicated: Secondary | ICD-10-CM | POA: Diagnosis not present

## 2024-04-12 DIAGNOSIS — D709 Neutropenia, unspecified: Secondary | ICD-10-CM | POA: Diagnosis not present

## 2024-04-12 DIAGNOSIS — E039 Hypothyroidism, unspecified: Secondary | ICD-10-CM | POA: Insufficient documentation

## 2024-04-12 DIAGNOSIS — D72819 Decreased white blood cell count, unspecified: Secondary | ICD-10-CM | POA: Diagnosis not present

## 2024-04-12 LAB — CBC WITH DIFFERENTIAL/PLATELET
Abs Immature Granulocytes: 0.04 10*3/uL (ref 0.00–0.07)
Basophils Absolute: 0 10*3/uL (ref 0.0–0.1)
Basophils Relative: 1 %
Eosinophils Absolute: 0.1 10*3/uL (ref 0.0–0.5)
Eosinophils Relative: 2 %
HCT: 41.1 % (ref 39.0–52.0)
Hemoglobin: 13 g/dL (ref 13.0–17.0)
Immature Granulocytes: 1 %
Lymphocytes Relative: 27 %
Lymphs Abs: 1 10*3/uL (ref 0.7–4.0)
MCH: 26.9 pg (ref 26.0–34.0)
MCHC: 31.6 g/dL (ref 30.0–36.0)
MCV: 85.1 fL (ref 80.0–100.0)
Monocytes Absolute: 0.3 10*3/uL (ref 0.1–1.0)
Monocytes Relative: 7 %
Neutro Abs: 2.3 10*3/uL (ref 1.7–7.7)
Neutrophils Relative %: 62 %
Platelets: 249 10*3/uL (ref 150–400)
RBC: 4.83 MIL/uL (ref 4.22–5.81)
RDW: 14 % (ref 11.5–15.5)
WBC: 3.6 10*3/uL — ABNORMAL LOW (ref 4.0–10.5)
nRBC: 0 % (ref 0.0–0.2)

## 2024-04-12 MED ORDER — MIDAZOLAM HCL 2 MG/2ML IJ SOLN
INTRAMUSCULAR | Status: AC | PRN
  Administered 2024-04-12 (×4): 1 mg via INTRAVENOUS

## 2024-04-12 MED ORDER — FENTANYL CITRATE (PF) 100 MCG/2ML IJ SOLN
INTRAMUSCULAR | Status: AC | PRN
  Administered 2024-04-12 (×2): 50 ug via INTRAVENOUS

## 2024-04-12 MED ORDER — MIDAZOLAM HCL 2 MG/2ML IJ SOLN
INTRAMUSCULAR | Status: AC
Start: 2024-04-12 — End: 2024-04-12
  Filled 2024-04-12: qty 4

## 2024-04-12 MED ORDER — FENTANYL CITRATE (PF) 100 MCG/2ML IJ SOLN
INTRAMUSCULAR | Status: AC
Start: 2024-04-12 — End: 2024-04-12
  Filled 2024-04-12: qty 2

## 2024-04-12 MED ORDER — SODIUM CHLORIDE 0.9 % IV SOLN: INTRAVENOUS | Status: DC

## 2024-04-12 NOTE — Discharge Instructions (Signed)
 PLEASE CALL [facility]OLOGY CLINIC AT [phone] WITH ANY QUESTIONS OR CONCERNS  YOU MAY REMOVE YOUR DRESSING AND SHOWER TOMORROW

## 2024-04-12 NOTE — Procedures (Signed)
 Pre procedural Dx: chronic leukopenia  Post procedural Dx: Same  Technically successful CT guided biopsy of right iliac bone marrow   EBL: None.   Complications: None immediate.   Zettie Hillock, MD Pager #: [phone]

## 2024-04-14 ENCOUNTER — Inpatient Hospital Stay: Attending: Internal Medicine

## 2024-04-14 ENCOUNTER — Telehealth: Payer: Self-pay | Admitting: Internal Medicine

## 2024-04-14 ENCOUNTER — Inpatient Hospital Stay: Admitting: Internal Medicine

## 2024-04-14 DIAGNOSIS — B359 Dermatophytosis, unspecified: Secondary | ICD-10-CM | POA: Diagnosis not present

## 2024-04-14 LAB — SURGICAL PATHOLOGY

## 2024-04-14 NOTE — Telephone Encounter (Signed)
 Sean Baker called in to see if he could be seen for a sooner appointment.

## 2024-04-18 ENCOUNTER — Telehealth: Payer: Self-pay | Admitting: Internal Medicine

## 2024-04-18 NOTE — Telephone Encounter (Signed)
 Left a voicemail with the rescheduled appointment details.

## 2024-04-19 NOTE — Progress Notes (Deleted)
 [facility] OFFICE PROGRESS NOTE  Sean Elsie SAUNDERS, MD [address] [place] [address]  DIAGNOSIS: Chronic leukopenia with WBC of 2.6 and absolute neutrophil count of 1200 since 2009. No recurrent infections, bleeding, or bruising. Differential includes autoimmune disorder, drug-induced leukopenia, or ethnic origin.   PRIOR THERAPY: None  CURRENT THERAPY: Observation    INTERVAL HISTORY: Sean Baker 63 y.o. male returns to the clinic today for follow-up visit.  The patient was last seen by Sean Baker on 03/16/2024.  Patient has a history of chronic leukopenia.  The patient when he was last seen Sean Baker arrange for a bone marrow biopsy and aspirate.  The patient had been followed since 2009 with chronic leukopenia.  The patient denies any frequent infections.  At his last appointment he was endorsing fatigue and weakness.  Prior workup includes negative ANA, rheumatoid factor with normal B12, iron, and folate.  His most recent ANA was positive.  The patient states his mother had leukemia and lupus on his paternal side.  Sean Baker refer the patient to rheumatology and he has an appointment on ***  Otherwise the patient denies any major changes in his health since he was last seen.  He denies any recent fever, chills, night sweats, or unexplained weight loss.  He denies any lymphadenopathy.  He denies any recent or frequent signs and symptoms of infection.  He denies any abnormal bleeding or bruising.  He denies any bloating or early satiety.  He denies any rashes.  He is here today for evaluation and to review his bone marrow biopsy results.  This was performed on 04/12/2024. MEDICAL HISTORY: Past Medical History:  Diagnosis Date   Asthma    Depression    Eczema    Thyroid  disease     ALLERGIES:  is allergic to latex; tamsulosin hcl; and tuberculin, ppd.  MEDICATIONS:  Current Outpatient Medications  Medication Sig Dispense Refill    albuterol  (PROVENTIL  HFA;VENTOLIN  HFA) 108 (90 BASE) MCG/ACT inhaler Inhale into the lungs every 6 (six) hours as needed for wheezing or shortness of breath.     ALPRAZolam (XANAX) 0.5 MG tablet Take 0.25-0.5 mg by mouth daily as needed.     ammonium lactate (LAC-HYDRIN) 12 % lotion Apply topically.     amphetamine-dextroamphetamine (ADDERALL) 10 MG tablet Take 10 mg by mouth daily with breakfast.     Ascorbic Acid (VITA-C PO) Take by mouth daily.     betamethasone  dipropionate 0.05 % lotion Apply topically as needed.     COD LIVER OIL PO Take by mouth daily.     FLUoxetine (PROZAC) 20 MG capsule Take by mouth.     fluticasone  furoate-vilanterol (BREO ELLIPTA ) 100-25 MCG/INH AEPB Inhale 1 puff into the lungs daily. 60 each 5   levothyroxine (SYNTHROID, LEVOTHROID) 88 MCG tablet Take 88 mcg by mouth daily before breakfast.     Multiple Vitamin (MULTIVITAMIN) tablet Take 1 tablet by mouth daily.     pantoprazole (PROTONIX) 20 MG tablet Take 20 mg by mouth daily.     predniSONE  (STERAPRED UNI-PAK 21 TAB) 10 MG (21) TBPK tablet Take by mouth daily. Take 6 tabs by mouth daily  for 2 days, then 5 tabs for 2 days, then 4 tabs for 2 days, then 3 tabs for 2 days, 2 tabs for 2 days, then 1 tab by mouth daily for 2 days (Patient not taking: Reported on 10/17/2023) 42 tablet 0   sildenafil (VIAGRA) 100 MG tablet Take by  mouth.     silodosin (RAPAFLO) 8 MG CAPS capsule Take 8 mg by mouth daily.     simvastatin (ZOCOR) 20 MG tablet Take 20 mg by mouth daily. (Patient not taking: Reported on 10/17/2023)     No current facility-administered medications for this visit.    SURGICAL HISTORY:  Past Surgical History:  Procedure Laterality Date   FOOT SURGERY  1994   NOSE SURGERY  1981    REVIEW OF SYSTEMS:   Review of Systems  Constitutional: Negative for appetite change, chills, fatigue, fever and unexpected weight change.  HENT:   Negative for mouth sores, nosebleeds, sore throat and trouble swallowing.    Eyes: Negative for eye problems and icterus.  Respiratory: Negative for cough, hemoptysis, shortness of breath and wheezing.   Cardiovascular: Negative for chest pain and leg swelling.  Gastrointestinal: Negative for abdominal pain, constipation, diarrhea, nausea and vomiting.  Genitourinary: Negative for bladder incontinence, difficulty urinating, dysuria, frequency and hematuria.   Musculoskeletal: Negative for back pain, gait problem, neck pain and neck stiffness.  Skin: Negative for itching and rash.  Neurological: Negative for dizziness, extremity weakness, gait problem, headaches, light-headedness and seizures.  Hematological: Negative for adenopathy. Does not bruise/bleed easily.  Psychiatric/Behavioral: Negative for confusion, depression and sleep disturbance. The patient is not nervous/anxious.     PHYSICAL EXAMINATION:  There were no vitals taken for this visit.  ECOG PERFORMANCE STATUS: {CHL ONC ECOG [id number]  Physical Exam  Constitutional: Oriented to person, place, and time and well-developed, well-nourished, and in no distress. No distress.  HENT:  Head: Normocephalic and atraumatic.  Mouth/Throat: Oropharynx is clear and moist. No oropharyngeal exudate.  Eyes: Conjunctivae are normal. Right eye exhibits no discharge. Left eye exhibits no discharge. No scleral icterus.  Neck: Normal range of motion. Neck supple.  Cardiovascular: Normal rate, regular rhythm, normal heart sounds and intact distal pulses.   Pulmonary/Chest: Effort normal and breath sounds normal. No respiratory distress. No wheezes. No rales.  Abdominal: Soft. Bowel sounds are normal. Exhibits no distension and no mass. There is no tenderness.  Musculoskeletal: Normal range of motion. Exhibits no edema.  Lymphadenopathy:    No cervical adenopathy.  Neurological: Alert and oriented to person, place, and time. Exhibits normal muscle tone. Gait normal. Coordination normal.  Skin: Skin is warm and dry.  No rash noted. Not diaphoretic. No erythema. No pallor.  Psychiatric: Mood, memory and judgment normal.  Vitals reviewed.  LABORATORY DATA: Lab Results  Component Value Date   WBC 3.6 (L) 04/12/2024   HGB 13.0 04/12/2024   HCT 41.1 04/12/2024   MCV 85.1 04/12/2024   PLT 249 04/12/2024      Chemistry      Component Value Date/Time   NA 139 10/17/2023 1042   K 4.1 10/17/2023 1042   CL 106 10/17/2023 1042   CO2 26 10/17/2023 1042   BUN 23 10/17/2023 1042   CREATININE 1.23 10/17/2023 1042      Component Value Date/Time   CALCIUM 9.6 10/17/2023 1042   ALKPHOS 65 10/17/2023 1042   AST 22 10/17/2023 1042   ALT 17 10/17/2023 1042   BILITOT 0.3 10/17/2023 1042       RADIOGRAPHIC STUDIES:  CT BONE MARROW BIOPSY & ASPIRATION Result Date: 04/12/2024 CLINICAL DATA:  Chronic leukopenia. EXAM: CT-guided bone marrow biopsy TECHNIQUE: CT pelvis CONTRAST:  None RADIOPHARMACEUTICALS:  None COMPARISON:  None FINDINGS: The patient was placed in prone position on the CT gantry. Radiopaque markers were placed on the patient's skin  and initial imaging of the pelvis was performed. The patient's skin was then prepped and draped in the usual sterile fashion. Moderate sedation was provided for by the nursing staff under my supervision utilizing intravenous Versed  and fentanyl . The nurse had no other duties other than monitoring the patient and providing sedation during the procedure. I was present for the entire procedure. 4 mg intravenous Versed  and 100 mcg intravenous fentanyl  were administered for a total sedation time of 10 minutes. 1% lidocaine was used to infiltrate the skin at the access site prior to a stab incision. Local anesthesia was then used to infiltrate the region of soft tissue from the skin to the right iliac bone. The bone marrow needle was then advanced and imaging demonstrated the needle tip to be in the cortex of the right iliac bone. The bone was then penetrated and a sample was  obtained. After the sample was evaluated, approximately 5 mL of heparinized bone marrow sample was obtained by aspiration. A core sample was then obtained. Multiple attempts at sampling was performed in order to get 2 1 cm segments. All needles were then removed from the patient. Sterile dressing was applied. IMPRESSION: Satisfactory core needle biopsy and aspiration of the right iliac bone marrow under CT guidance. Electronically Signed   By: Cordella Banner   On: 04/12/2024 15:18     ASSESSMENT/PLAN:  This is a very pleasant 63 year old male with chronic leukopenia.  He has had chronic leukopenia since 2009.  The patient recently had a bone marrow biopsy and aspirate performed.  This showed***. Labs today showed ***  Sean Baker recommends that the patient continue on observation with repeat blood work in 6 months.   He will follow up with rheumatology for the positive ANA.   The patient was advised to call immediately if she has any concerning symptoms in the interval. The patient voices understanding of current disease status and treatment options and is in agreement with the current care plan. All questions were answered. The patient knows to call the clinic with any problems, questions or concerns. We can certainly see the patient much sooner if necessary      No orders of the defined types were placed in this encounter.    I spent {CHL ONC TIME VISIT - DTPQU:[id number]} counseling the patient face to face. The total time spent in the appointment was {CHL ONC TIME VISIT - DTPQU:[id number]}.  Sean Vasco L Carisha Kantor, PA-C 04/19/24

## 2024-04-20 ENCOUNTER — Encounter (HOSPITAL_COMMUNITY): Payer: Self-pay | Admitting: Internal Medicine

## 2024-04-21 ENCOUNTER — Inpatient Hospital Stay: Admitting: Physician Assistant

## 2024-04-21 ENCOUNTER — Inpatient Hospital Stay: Attending: Internal Medicine

## 2024-04-21 ENCOUNTER — Encounter (HOSPITAL_COMMUNITY): Payer: Self-pay | Admitting: Internal Medicine

## 2024-04-21 ENCOUNTER — Telehealth: Payer: Self-pay

## 2024-04-21 NOTE — Telephone Encounter (Signed)
 Tried to reach patient in regards to appts today.  LVM for a return call to reschedule.

## 2024-04-28 ENCOUNTER — Inpatient Hospital Stay: Admitting: Physician Assistant

## 2024-04-28 ENCOUNTER — Inpatient Hospital Stay: Attending: Internal Medicine

## 2024-04-28 VITALS — BP 101/62 | HR 90 | Temp 97.1°F | Resp 16 | Wt 151.5 lb

## 2024-04-28 DIAGNOSIS — D72819 Decreased white blood cell count, unspecified: Secondary | ICD-10-CM

## 2024-04-28 LAB — CMP (CANCER CENTER ONLY)
ALT: 20 U/L (ref 0–44)
AST: 22 U/L (ref 15–41)
Albumin: 4.3 g/dL (ref 3.5–5.0)
Alkaline Phosphatase: 71 U/L (ref 38–126)
Anion gap: 7 (ref 5–15)
BUN: 13 mg/dL (ref 8–23)
CO2: 30 mmol/L (ref 22–32)
Calcium: 9.9 mg/dL (ref 8.9–10.3)
Chloride: 100 mmol/L (ref 98–111)
Creatinine: 1.17 mg/dL (ref 0.61–1.24)
GFR, Estimated: >60 mL/min (ref >60)
Glucose, Bld: 90 mg/dL (ref 70–99)
Potassium: 4.6 mmol/L (ref 3.5–5.1)
Sodium: 137 mmol/L (ref 135–145)
Total Bilirubin: 0.6 mg/dL (ref 0.0–1.2)
Total Protein: 7.5 g/dL (ref 6.5–8.1)

## 2024-04-28 LAB — CBC WITH DIFFERENTIAL (CANCER CENTER ONLY)
Abs Immature Granulocytes: 0.02 10*3/uL (ref 0.00–0.07)
Basophils Absolute: 0 10*3/uL (ref 0.0–0.1)
Basophils Relative: 0 %
Eosinophils Absolute: 0 10*3/uL (ref 0.0–0.5)
Eosinophils Relative: 1 %
HCT: 43.9 % (ref 39.0–52.0)
Hemoglobin: 14.4 g/dL (ref 13.0–17.0)
Immature Granulocytes: 1 %
Lymphocytes Relative: 33 %
Lymphs Abs: 1 10*3/uL (ref 0.7–4.0)
MCH: 27.1 pg (ref 26.0–34.0)
MCHC: 32.8 g/dL (ref 30.0–36.0)
MCV: 82.7 fL (ref 80.0–100.0)
Monocytes Absolute: 0.3 10*3/uL (ref 0.1–1.0)
Monocytes Relative: 10 %
Neutro Abs: 1.7 10*3/uL (ref 1.7–7.7)
Neutrophils Relative %: 55 %
Platelet Count: 247 10*3/uL (ref 150–400)
RBC: 5.31 MIL/uL (ref 4.22–5.81)
RDW: 13.8 % (ref 11.5–15.5)
WBC Count: 3 10*3/uL — ABNORMAL LOW (ref 4.0–10.5)
nRBC: 0 % (ref 0.0–0.2)

## 2024-04-28 LAB — LACTATE DEHYDROGENASE: LDH: 165 U/L (ref 98–192)

## 2024-04-28 NOTE — Progress Notes (Signed)
 [facility] OFFICE PROGRESS NOTE  [name], MD [address] [place] [address]  DIAGNOSIS: Chronic leukopenia with WBC of 2.6 and absolute neutrophil count of 1200 since 2009. No recurrent infections, bleeding, or bruising. Differential includes autoimmune disorder, drug-induced leukopenia, or ethnic origin.   PRIOR THERAPY: None  CURRENT THERAPY: Observation    INTERVAL HISTORY: Sean Baker 63 y.o. male returns to the clinic today for follow-up visit.  The patient was last seen by Dr. Sherrod on 03/16/2024. The patient has a history of chronic leukopenia. The patient when he was last seen Dr. Sherrod arrange for a bone marrow biopsy and aspirate.  The patient had been followed since 2009 with chronic leukopenia.  The patient denies any frequent infections.  At his last appointment he was endorsing fatigue and weakness. He had some episodes where he had abrupt drop in his energy. He had EMS called to his job twice secondary to fatigue and feeling hot.   Prior workup includes negative ANA, rheumatoid factor with normal B12, iron, and folate.  His most recent ANA was positive.  The patient states his mother had leukemia and lupus on his paternal side.  He does drink alcohol after work with beer and has some drinks on the weekend. He takes a multivitamin daily.   Otherwise the patient denies any major changes in his health since he was last seen. He still has some fatigue. He also has thyroid  dysfunction and has lost some weight.  He also does not eat meals on a regular basis. He denies any recent fever, chills, or night sweats. He denies any lymphadenopathy. He denies any recent or frequent signs and symptoms of infection.  He denies any abnormal bleeding or bruising except he has had occasional nose bleeding a few times a year since he was a child. He is here today for evaluation and to review his bone marrow biopsy results.  This was performed on  04/12/2024.   MEDICAL HISTORY: Past Medical History:  Diagnosis Date   Asthma    Depression    Eczema    Thyroid  disease     ALLERGIES:  is allergic to latex; tamsulosin hcl; and tuberculin, ppd.  MEDICATIONS:  Current Outpatient Medications  Medication Sig Dispense Refill   albuterol  (PROVENTIL  HFA;VENTOLIN  HFA) 108 (90 BASE) MCG/ACT inhaler Inhale into the lungs every 6 (six) hours as needed for wheezing or shortness of breath.     ALPRAZolam (XANAX) 0.5 MG tablet Take 0.25-0.5 mg by mouth daily as needed.     ammonium lactate (LAC-HYDRIN) 12 % lotion Apply topically.     amphetamine-dextroamphetamine (ADDERALL) 10 MG tablet Take 10 mg by mouth daily with breakfast.     Ascorbic Acid (VITA-C PO) Take by mouth daily.     betamethasone  dipropionate 0.05 % lotion Apply topically as needed.     COD LIVER OIL PO Take by mouth daily.     FLUoxetine (PROZAC) 20 MG capsule Take by mouth.     fluticasone  furoate-vilanterol (BREO ELLIPTA ) 100-25 MCG/INH AEPB Inhale 1 puff into the lungs daily. 60 each 5   levothyroxine (SYNTHROID, LEVOTHROID) 88 MCG tablet Take 88 mcg by mouth daily before breakfast.     Multiple Vitamin (MULTIVITAMIN) tablet Take 1 tablet by mouth daily.     pantoprazole (PROTONIX) 20 MG tablet Take 20 mg by mouth daily.     predniSONE  (STERAPRED UNI-PAK 21 TAB) 10 MG (21) TBPK tablet Take by mouth daily. Take 6  tabs by mouth daily  for 2 days, then 5 tabs for 2 days, then 4 tabs for 2 days, then 3 tabs for 2 days, 2 tabs for 2 days, then 1 tab by mouth daily for 2 days (Patient not taking: Reported on 10/17/2023) 42 tablet 0   sildenafil (VIAGRA) 100 MG tablet Take by mouth.     silodosin (RAPAFLO) 8 MG CAPS capsule Take 8 mg by mouth daily.     simvastatin (ZOCOR) 20 MG tablet Take 20 mg by mouth daily. (Patient not taking: Reported on 10/17/2023)     No current facility-administered medications for this visit.    SURGICAL HISTORY:  Past Surgical History:  Procedure  Laterality Date   FOOT SURGERY  1994   NOSE SURGERY  1981    REVIEW OF SYSTEMS:   Review of Systems  Constitutional: Positive for fatigue and weight loss. Negative for chills and fever.  HENT:  Occasional epistaxis. Negative for mouth sores, nosebleeds, sore throat and trouble swallowing.   Eyes: Negative for eye problems and icterus.  Respiratory: Negative for cough, hemoptysis, shortness of breath and wheezing.   Cardiovascular: Negative for chest pain and leg swelling.  Gastrointestinal: Negative for abdominal pain, constipation, diarrhea, nausea and vomiting.  Genitourinary: Negative for bladder incontinence, difficulty urinating, dysuria, frequency and hematuria.   Musculoskeletal: Negative for back pain, gait problem, neck pain and neck stiffness.  Skin: Negative for itching and rash.  Neurological: Negative for dizziness, extremity weakness, gait problem, headaches, light-headedness and seizures.  Hematological: Negative for adenopathy. Does not bruise/bleed easily.  Psychiatric/Behavioral: Negative for confusion, depression and sleep disturbance. The patient is not nervous/anxious.     PHYSICAL EXAMINATION:  There were no vitals taken for this visit.  ECOG PERFORMANCE STATUS: 0  Physical Exam  Constitutional: Oriented to person, place, and time and well-developed, well-nourished, and in no distress.   HENT:  Head: Normocephalic and atraumatic.  Mouth/Throat: Oropharynx is clear and moist. No oropharyngeal exudate.  Eyes: Conjunctivae are normal. Right eye exhibits no discharge. Left eye exhibits no discharge. No scleral icterus.  Neck: Normal range of motion. Neck supple.  Cardiovascular: Normal rate, regular rhythm, normal heart sounds and intact distal pulses.   Pulmonary/Chest: Effort normal and breath sounds normal. No respiratory distress. No wheezes. No rales.  Abdominal: Soft. Bowel sounds are normal. Exhibits no distension and no mass. There is no tenderness.   Musculoskeletal: Normal range of motion. Exhibits no edema.  Lymphadenopathy:    No cervical adenopathy.  Neurological: Alert and oriented to person, place, and time. Exhibits normal muscle tone. Gait normal. Coordination normal.  Skin: Skin is warm and dry. No rash noted. Not diaphoretic. No erythema. No pallor.  Psychiatric: Mood, memory and judgment normal.  Vitals reviewed.  LABORATORY DATA: Lab Results  Component Value Date   WBC 3.0 (L) 04/28/2024   HGB 14.4 04/28/2024   HCT 43.9 04/28/2024   MCV 82.7 04/28/2024   PLT 247 04/28/2024      Chemistry      Component Value Date/Time   NA 139 10/17/2023 1042   K 4.1 10/17/2023 1042   CL 106 10/17/2023 1042   CO2 26 10/17/2023 1042   BUN 23 10/17/2023 1042   CREATININE 1.23 10/17/2023 1042      Component Value Date/Time   CALCIUM 9.6 10/17/2023 1042   ALKPHOS 65 10/17/2023 1042   AST 22 10/17/2023 1042   ALT 17 10/17/2023 1042   BILITOT 0.3 10/17/2023 1042  RADIOGRAPHIC STUDIES:  CT BONE MARROW BIOPSY & ASPIRATION Result Date: 04/12/2024 CLINICAL DATA:  Chronic leukopenia. EXAM: CT-guided bone marrow biopsy TECHNIQUE: CT pelvis CONTRAST:  None RADIOPHARMACEUTICALS:  None COMPARISON:  None FINDINGS: The patient was placed in prone position on the CT gantry. Radiopaque markers were placed on the patient's skin and initial imaging of the pelvis was performed. The patient's skin was then prepped and draped in the usual sterile fashion. Moderate sedation was provided for by the nursing staff under my supervision utilizing intravenous Versed  and fentanyl . The nurse had no other duties other than monitoring the patient and providing sedation during the procedure. I was present for the entire procedure. 4 mg intravenous Versed  and 100 mcg intravenous fentanyl  were administered for a total sedation time of 10 minutes. 1% lidocaine was used to infiltrate the skin at the access site prior to a stab incision. Local anesthesia was  then used to infiltrate the region of soft tissue from the skin to the right iliac bone. The bone marrow needle was then advanced and imaging demonstrated the needle tip to be in the cortex of the right iliac bone. The bone was then penetrated and a sample was obtained. After the sample was evaluated, approximately 5 mL of heparinized bone marrow sample was obtained by aspiration. A core sample was then obtained. Multiple attempts at sampling was performed in order to get 2 1 cm segments. All needles were then removed from the patient. Sterile dressing was applied. IMPRESSION: Satisfactory core needle biopsy and aspiration of the right iliac bone marrow under CT guidance. Electronically Signed   By: Cordella Banner   On: 04/12/2024 15:18     ASSESSMENT/PLAN:  This is a very pleasant 63 year old male with chronic leukopenia.  He has had chronic leukopenia since 2009.  The patient recently had a bone marrow biopsy and aspirate performed.  This showed no concerning findings for a bone marrow pathology. Labs today showed WBC of 3.0.   Dr. Sherrod recommends that the patient continue on observation with repeat blood work in 6 months.   He was advised to reduce alcohol intake, avoid NSAIDs and use tylenol  instead, and eat more meals on a regular basis.   He had two episodes at work for feeling fatigued, faint, and hot which may have been precipitated by not eating.   The patient was advised to call immediately if she has any concerning symptoms in the interval. The patient voices understanding of current disease status and treatment options and is in agreement with the current care plan. All questions were answered. The patient knows to call the clinic with any problems, questions or concerns. We can certainly see the patient much sooner if necessary   No orders of the defined types were placed in this encounter.    Keegan Ducey L Elo Marmolejos, PA-C 04/28/24  ADDENDUM: Hematology/Oncology  Attending:  I had a face-to-face encounter with the patient today.  I reviewed his records, lab, recent bone marrow biopsy results and recommended his care plan.  This is a very pleasant 63 years old African-American male with chronic leukocytopenia that has been going on since 2009.  It is likely to be either ethnic in origin or related to autoimmune disorder as well as drug-induced leukopenia secondary to NSAIDs and alcohol.  The patient had a bone marrow biopsy and aspirate performed recently.  I reviewed the bone marrow results with the patient and his wife today and it showed no concerning findings for myeloproliferative or myelodysplastic syndrome.  The cytogenetic was unremarkable 2. I recommended for the patient to continue on observation with repeat blood work in 6 months. I advised him to start taking over-the-counter multivitamin supplements.  He was also strongly advised to avoid NSAIDs and also decrease the amount of alcohol drinking. He was advised to call immediately if he has any other concerning symptoms in the interval. The total time spent in the appointment was 30 minutes including review of chart and various tests results, discussions about plan of care and coordination of care plan . Disclaimer: This note was dictated with voice recognition software. Similar sounding words can inadvertently be transcribed and may be missed upon review. Sherrod MARLA Sherrod, MD

## 2024-05-03 ENCOUNTER — Telehealth: Payer: Self-pay

## 2024-05-03 NOTE — Telephone Encounter (Signed)
 Called to notify orthotics are still here pt can pu no appt needed.. no charge  2nd attempt

## 2024-05-04 ENCOUNTER — Telehealth: Payer: Self-pay | Admitting: Internal Medicine

## 2024-05-04 NOTE — Telephone Encounter (Signed)
 Left the patient a voicemail with the scheduled appointment details.

## 2024-05-13 DIAGNOSIS — F329 Major depressive disorder, single episode, unspecified: Secondary | ICD-10-CM | POA: Diagnosis not present

## 2024-05-13 DIAGNOSIS — F4312 Post-traumatic stress disorder, chronic: Secondary | ICD-10-CM | POA: Diagnosis not present

## 2024-06-03 DIAGNOSIS — F4312 Post-traumatic stress disorder, chronic: Secondary | ICD-10-CM | POA: Diagnosis not present

## 2024-06-10 DIAGNOSIS — F4312 Post-traumatic stress disorder, chronic: Secondary | ICD-10-CM | POA: Diagnosis not present

## 2024-07-01 DIAGNOSIS — F4312 Post-traumatic stress disorder, chronic: Secondary | ICD-10-CM | POA: Diagnosis not present

## 2024-07-01 DIAGNOSIS — F329 Major depressive disorder, single episode, unspecified: Secondary | ICD-10-CM | POA: Diagnosis not present

## 2024-07-06 ENCOUNTER — Ambulatory Visit
Admission: EM | Admit: 2024-07-06 | Discharge: 2024-07-06 | Disposition: A | Discharge status: Home / Self Care | Attending: Family Medicine | Admitting: Family Medicine

## 2024-07-06 DIAGNOSIS — J111 Influenza due to unidentified influenza virus with other respiratory manifestations: Secondary | ICD-10-CM

## 2024-07-06 DIAGNOSIS — J069 Acute upper respiratory infection, unspecified: Secondary | ICD-10-CM

## 2024-07-06 DIAGNOSIS — R059 Cough, unspecified: Secondary | ICD-10-CM | POA: Diagnosis not present

## 2024-07-06 LAB — POC SARS CORONAVIRUS 2 AG -  ED: SARS Coronavirus 2 Ag: NEGATIVE

## 2024-07-06 MED ORDER — PREDNISONE 20 MG PO TABS: ORAL_TABLET | ORAL | 0 refills | Status: AC

## 2024-07-06 MED ORDER — AMOXICILLIN-POT CLAVULANATE 875-125 MG PO TABS: 1.0000 | ORAL_TABLET | Freq: Two times a day (BID) | ORAL | 0 refills | Status: AC

## 2024-07-06 NOTE — ED Provider Notes (Signed)
 [facility]    CSN: [id number] Arrival date & time: 07/06/24  1806      History   Chief Complaint Chief Complaint  Patient presents with   Nasal Congestion   Cough   Diarrhea   Sore Throat    HPI Sean Baker is a 63 y.o. male.   HPI 63 year old male presents with diarrhea, congestion, cough, and fatigue since Saturday, 07/02/2024.  PMH significant for asthma, depression and leukopenia.  Patient is accompanied by his wife this evening.  Past Medical History:  Diagnosis Date   Asthma    Depression    Eczema    Thyroid  disease     Patient Active Problem List   Diagnosis Date Noted   Leukopenia 04/28/2024   Mild intermittent asthma without complication 11/28/2016    Past Surgical History:  Procedure Laterality Date   FOOT SURGERY  1994   NOSE SURGERY  1981       Home Medications    Prior to Admission medications   Medication Sig Start Date End Date Taking? Authorizing Provider  amoxicillin -clavulanate (AUGMENTIN ) 875-125 MG tablet Take 1 tablet by mouth every 12 (twelve) hours. 07/06/24  Yes Teddy Sharper, FNP  predniSONE  (DELTASONE ) 20 MG tablet Take 3 tabs PO daily x 5 days. 07/06/24  Yes Apryll Hinkle, FNP  albuterol  (PROVENTIL  HFA;VENTOLIN  HFA) 108 (90 BASE) MCG/ACT inhaler Inhale into the lungs every 6 (six) hours as needed for wheezing or shortness of breath.    [provider]  ALPRAZolam (XANAX) 0.5 MG tablet Take 0.25-0.5 mg by mouth daily as needed. 07/01/22   [provider]  ammonium lactate (LAC-HYDRIN) 12 % lotion Apply topically. 09/27/20   [provider]  amphetamine-dextroamphetamine (ADDERALL) 10 MG tablet Take 10 mg by mouth daily with breakfast.    [provider]  Ascorbic Acid (VITA-C PO) Take by mouth daily.    [provider]  betamethasone  dipropionate 0.05 % lotion Apply topically as needed.    [provider]  COD LIVER OIL PO Take by mouth daily.    [provider]  FLUoxetine (PROZAC) 20 MG capsule Take by mouth. 03/22/22   [provider]  fluticasone  furoate-vilanterol (BREO ELLIPTA ) 100-25 MCG/INH AEPB Inhale 1 puff into the lungs daily. 04/23/17   Mannam, Praveen, MD  levothyroxine (SYNTHROID, LEVOTHROID) 88 MCG tablet Take 88 mcg by mouth daily before breakfast.    [provider]  Multiple Vitamin (MULTIVITAMIN) tablet Take 1 tablet by mouth daily.    [provider]  pantoprazole (PROTONIX) 20 MG tablet Take 20 mg by mouth daily.    [provider]  sildenafil (VIAGRA) 100 MG tablet Take by mouth. 04/17/21   [provider]  silodosin (RAPAFLO) 8 MG CAPS capsule Take 8 mg by mouth daily. 07/24/22   [provider]  simvastatin (ZOCOR) 20 MG tablet Take 20 mg by mouth daily. Patient not taking: Reported on 10/17/2023    [provider]    Family History Family History  Problem Relation Age of Onset   Heart attack Father        Occurred when 53yrs old. Minor    Social History Social History   Tobacco Use   Smoking status: Every Day    Types: Cigars   Smokeless tobacco: Never  Vaping Use   Vaping status: Never Used  Substance Use Topics   Alcohol use: Yes    Comment: occ   Drug use: No     Allergies  Latex; Tamsulosin hcl; and Tuberculin, ppd   Review of Systems Review of Systems  Constitutional:  Positive for fatigue.  HENT:  Positive for congestion.   Respiratory:  Positive for cough.   Gastrointestinal:  Positive for diarrhea.  All other systems reviewed and are negative.    Physical Exam Triage Vital Signs ED Triage Vitals  Encounter Vitals Group     BP      Girls Systolic BP Percentile      Girls Diastolic BP Percentile      Boys Systolic BP Percentile      Boys Diastolic BP Percentile      Pulse      Resp      Temp      Temp src      SpO2      Weight      Height      Head Circumference      Peak Flow      Pain Score      Pain  Loc      Pain Education      Exclude from Growth Chart    No data found.  Updated Vital Signs BP 118/77 (BP Location: Right Arm)   Pulse 71   Temp 98.8 F (37.1 C) (Oral)   Resp 17   SpO2 99%    Physical Exam Vitals and nursing note reviewed.  Constitutional:      General: He is not in acute distress.    Appearance: Normal appearance. He is normal weight. He is ill-appearing.  HENT:     Head: Normocephalic and atraumatic.     Right Ear: Tympanic membrane, ear canal and external ear normal.     Left Ear: Tympanic membrane, ear canal and external ear normal.     Mouth/Throat:     Mouth: Mucous membranes are moist.     Pharynx: Oropharynx is clear.  Eyes:     Extraocular Movements: Extraocular movements intact.     Pupils: Pupils are equal, round, and reactive to light.  Cardiovascular:     Rate and Rhythm: Normal rate and regular rhythm.     Pulses: Normal pulses.     Heart sounds: Normal heart sounds.  Pulmonary:     Effort: Pulmonary effort is normal.     Breath sounds: Normal breath sounds. No wheezing, rhonchi or rales.     Comments: Infrequent nonproductive cough on exam Musculoskeletal:        General: Normal range of motion.     Cervical back: Normal range of motion and neck supple.  Skin:    General: Skin is warm and dry.  Neurological:     General: No focal deficit present.     Mental Status: He is alert and oriented to person, place, and time. Mental status is at baseline.  Psychiatric:        Mood and Affect: Mood normal.        Behavior: Behavior normal.      UC Treatments / Results  Labs (all labs ordered are listed, but only abnormal results are displayed) Labs Reviewed  POC SARS CORONAVIRUS 2 AG -  ED    EKG   Radiology No results found.  Procedures Procedures (including critical care time)  Medications Ordered in UC Medications - No data to display  Initial Impression / Assessment and Plan / UC Course  I have reviewed the triage  vital signs and the nursing notes.  Pertinent labs & imaging results that were available  during my care of the patient were reviewed by me and considered in my medical decision making (see chart for details).     MDM: 1.  Acute URI-Rx'd Augmentin  875/125 mg tablet, take 1 tablet twice daily x 7 days; 2.  Cough, unspecified type-Rx'd prednisone  20 mg tablet: Take 3 tablets p.o. daily x 5 days. Advised patient to take medications as directed with food to completion.  Advised patient to take prednisone  with first dose of Augmentin  for the next 5 of 7 days.  Encouraged to increase daily water intake to 64 ounces per day while taking these medications.  Advised if symptoms worsen and/or unresolved please follow-up with your PCP or here for further evaluation.  Patient discharged home, hemodynamically stable.  Work note provided to patient prior to discharge per request. Final Clinical Impressions(s) / UC Diagnoses   Final diagnoses:  Influenza-like illness  Acute URI  Cough, unspecified type     Discharge Instructions      Advised patient to take medications as directed with food to completion.  Advised patient to take prednisone  with first dose of Augmentin  for the next 5 of 7 days.  Encouraged to increase daily water intake to 64 ounces per day while taking these medications.  Advised if symptoms worsen and/or unresolved please follow-up with your PCP or here for further evaluation.     ED Prescriptions     Medication Sig Dispense Auth. Provider   amoxicillin -clavulanate (AUGMENTIN ) 875-125 MG tablet Take 1 tablet by mouth every 12 (twelve) hours. 14 tablet Huyen Perazzo, FNP   predniSONE  (DELTASONE ) 20 MG tablet Take 3 tabs PO daily x 5 days. 15 tablet Electra Paladino, FNP      PDMP not reviewed this encounter.   Teddy Sharper, FNP 07/06/24 1904

## 2024-07-06 NOTE — ED Triage Notes (Signed)
 Pt c/o cough, congestion, sore throat and diarrhea since Saturday. Immodium, OTC cold med and mucinex prn.

## 2024-07-06 NOTE — Discharge Instructions (Addendum)
 Advised patient to take medications as directed with food to completion.  Advised patient to take prednisone with first dose of Augmentin for the next 5 of 7 days.  Encouraged to increase daily water intake to 64 ounces per day while taking these medications.  Advised if symptoms worsen and/or unresolved please follow-up with your PCP or here for further evaluation.

## 2024-07-08 DIAGNOSIS — L280 Lichen simplex chronicus: Secondary | ICD-10-CM | POA: Diagnosis not present

## 2024-07-19 DIAGNOSIS — F900 Attention-deficit hyperactivity disorder, predominantly inattentive type: Secondary | ICD-10-CM | POA: Diagnosis not present

## 2024-07-19 DIAGNOSIS — F3342 Major depressive disorder, recurrent, in full remission: Secondary | ICD-10-CM | POA: Diagnosis not present

## 2024-07-19 DIAGNOSIS — F422 Mixed obsessional thoughts and acts: Secondary | ICD-10-CM | POA: Diagnosis not present

## 2024-07-22 DIAGNOSIS — F4312 Post-traumatic stress disorder, chronic: Secondary | ICD-10-CM | POA: Diagnosis not present

## 2024-07-22 DIAGNOSIS — F329 Major depressive disorder, single episode, unspecified: Secondary | ICD-10-CM | POA: Diagnosis not present

## 2024-08-09 DIAGNOSIS — M4802 Spinal stenosis, cervical region: Secondary | ICD-10-CM | POA: Diagnosis not present

## 2024-08-09 DIAGNOSIS — M48062 Spinal stenosis, lumbar region with neurogenic claudication: Secondary | ICD-10-CM | POA: Diagnosis not present

## 2024-08-09 DIAGNOSIS — M5412 Radiculopathy, cervical region: Secondary | ICD-10-CM | POA: Diagnosis not present

## 2024-08-12 DIAGNOSIS — D485 Neoplasm of uncertain behavior of skin: Secondary | ICD-10-CM | POA: Diagnosis not present

## 2024-08-12 DIAGNOSIS — L309 Dermatitis, unspecified: Secondary | ICD-10-CM | POA: Diagnosis not present

## 2024-08-19 DIAGNOSIS — F4312 Post-traumatic stress disorder, chronic: Secondary | ICD-10-CM | POA: Diagnosis not present

## 2024-08-19 DIAGNOSIS — F329 Major depressive disorder, single episode, unspecified: Secondary | ICD-10-CM | POA: Diagnosis not present

## 2024-09-02 DIAGNOSIS — F329 Major depressive disorder, single episode, unspecified: Secondary | ICD-10-CM | POA: Diagnosis not present

## 2024-09-02 DIAGNOSIS — F4312 Post-traumatic stress disorder, chronic: Secondary | ICD-10-CM | POA: Diagnosis not present

## 2024-09-05 DIAGNOSIS — I10 Essential (primary) hypertension: Secondary | ICD-10-CM | POA: Diagnosis not present

## 2024-09-05 DIAGNOSIS — E039 Hypothyroidism, unspecified: Secondary | ICD-10-CM | POA: Diagnosis not present

## 2024-09-05 DIAGNOSIS — Z23 Encounter for immunization: Secondary | ICD-10-CM | POA: Diagnosis not present

## 2024-09-05 DIAGNOSIS — Z13220 Encounter for screening for lipoid disorders: Secondary | ICD-10-CM | POA: Diagnosis not present

## 2024-09-05 DIAGNOSIS — Z0000 Encounter for general adult medical examination without abnormal findings: Secondary | ICD-10-CM | POA: Diagnosis not present

## 2024-09-05 DIAGNOSIS — M5412 Radiculopathy, cervical region: Secondary | ICD-10-CM | POA: Diagnosis not present

## 2024-09-06 DIAGNOSIS — F900 Attention-deficit hyperactivity disorder, predominantly inattentive type: Secondary | ICD-10-CM | POA: Diagnosis not present

## 2024-09-06 DIAGNOSIS — L930 Discoid lupus erythematosus: Secondary | ICD-10-CM | POA: Diagnosis not present

## 2024-09-06 DIAGNOSIS — L659 Nonscarring hair loss, unspecified: Secondary | ICD-10-CM | POA: Diagnosis not present

## 2024-09-06 DIAGNOSIS — E039 Hypothyroidism, unspecified: Secondary | ICD-10-CM | POA: Diagnosis not present

## 2024-09-06 DIAGNOSIS — N401 Enlarged prostate with lower urinary tract symptoms: Secondary | ICD-10-CM | POA: Diagnosis not present

## 2024-09-26 DIAGNOSIS — M4722 Other spondylosis with radiculopathy, cervical region: Secondary | ICD-10-CM | POA: Diagnosis not present

## 2024-09-26 DIAGNOSIS — M47816 Spondylosis without myelopathy or radiculopathy, lumbar region: Secondary | ICD-10-CM | POA: Diagnosis not present

## 2024-09-26 DIAGNOSIS — M542 Cervicalgia: Secondary | ICD-10-CM | POA: Diagnosis not present

## 2024-09-26 DIAGNOSIS — M545 Low back pain, unspecified: Secondary | ICD-10-CM | POA: Diagnosis not present

## 2024-09-26 NOTE — Progress Notes (Addendum)
 "  Subjective:    Sean Baker is a 63 y.o. (DOB 31-Jan-1961) male.     Patient presents with   Lower Back - Pain   Follow-up     HPI Patient was last seen for his lumbar spine in early 2022.  Since that time he is been seen numerous times for his cervical spine.  He was seen on 09/05/2024 for therapeutic left C5 SNRB with Dr. Darlean.  He reports it helped but not as much as it typically does.we have previously ordered physical therapy for his cervical spine but he has not yet received a call to get set up.    History of LBP intermittently since 2019.  More recently his neck has been more of a problem but he reports increasing low back pain over the last few months.  He denies falls, injuries or changes in activities.  The pain is in the central low back area.  He denies radiation into his lower extremities.  He rates the pain as moderate in nature.  He describes it as a tightening.  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  On his breaks and lunch he gets up and walks outside to loosen up.  This activity is very helpful but unfortunately when he gets back to his desk and has to sit down for several hours all the symptoms return.  Denies red flag signs or symptoms.  Cervical spine: Diffuse cervical spondylosis and foraminal narrowing.  Recent left C5 SNRB on 09/05/2024 with minimal improvement.  He is disappointed as he usually gets excellent improvement with his injection.  Pain is in left lower posterior cervical paraspinals, severe in left shoulder and upper arm with intermittent P/N down to the hand. Pred pack with partial relief only while on med. No right UE symptoms.  We ordered physical therapy for his neck back in October that he has not started. Cervical history:  He reports symptoms began in early October 2022. Denies injury. When symptoms persisted and began radiating into left UE he got concerned and went to the ED. They  ruled out CVA and cardiac issues.     Reviewed and updated this visit by provider: Tobacco  Allergies  Meds  Problems  Med Hx  Surg Hx  Fam Hx       Review of Systems  Constitutional:  Negative for activity change, appetite change, chills, diaphoresis, fatigue, fever and unexpected weight change.  Respiratory:  Negative for shortness of breath.   Cardiovascular:  Negative for chest pain.  Genitourinary:  Negative for difficulty urinating.      Objective:   Vitals:   09/26/24 0840  BP: (!) 154/90  Pulse: 76  Height: 5' 10 (1.778 m)  Weight: 157 lb (71.2 kg)  BMI (Calculated): 22.5  PainSc:   5   Physical Exam Constitutional:      General: He is not in acute distress.    Appearance: Normal appearance. He is not ill-appearing or toxic-appearing.  Musculoskeletal:     Cervical back: No deformity.     Thoracic back: No deformity.  Neurological:     Mental Status: He is alert.        Imaging: -X-rays of the lumbar spine, 4 views, done in the office today, 09/26/2024.  Imaging was reviewed with the patient.  Diffuse severe degenerative disc disease with near complete loss of all disc heights.  Diffuse facet arthropathy.  Mild degenerative  scoli curve with L3-4 rotary subluxation.  No acute findings, fractures or instability noted. - X-rays C and L spine done 09/05/21. Moderate to severe spondylosis throughout both C and L spine, decreased disc height and anterior spurring throughout. No instability or acute findings noted.  - CT C-spine 08/30/21 on [facility]: No fracture or static subluxation of the cervical spine. Moderate cervical disc degenerative disease. - MRI C-spine 08/30/21 on [facility]: Mild spinal canal stenosis at C3-4 and C4-5.Foraminal stenosis, severe at C4-5.  Injections: - Left C5 SNRB 09/05/2024 with minimal improvement. - Left C5 snrb 03/2022 with 97% relief for 6 months and then gradual return. - Left C5 snrb 04/2023 with 100% relief which remains  sustained  Assessment / Plan:   Assessment 1. Lumbar pain (Primary) -     XR Spine Lumbar Min 4 Views -     AMB REFERRAL TO PHYSICAL THERAPY EVALUATION AND TREATMENT; Future 2. Spondylosis without myelopathy or radiculopathy, lumbar region -     AMB REFERRAL TO PHYSICAL THERAPY EVALUATION AND TREATMENT; Future -     predniSONE  (DELTASONE ) 10 mg tablet; 10 mg pred pack. Tapering pack, take as directed for 6 days, Normal -     meloxicam (MOBIC) 15 mg tablet; Take one tablet (15 mg dose) by mouth daily for 30 days. Begin after pred completed, Starting Mon 09/26/2024, Until Wed 10/26/2024, Normal 3. Cervicalgia 4. Cervical spondylosis with radiculopathy    Plan Patient will participate in activities as tolerated using pain as their guide. Patient was educated on current diagnosis and typical treatment algorithm.  Symptoms and exam are consistent with lumbar spondylosis and facet arthropathy as well as myofascial pain.  Prescriptions for prednisone  Dosepak and were sent to the pharmacy.  He will begin with prednisone  Dosepak and after complaining begin meloxicam as needed.  Side effects and instructions of both medications were reviewed with the patient today.  He will begin physical therapy for his low back.  A referral was sent today.  I explained to him that once they call to set that up he should also let them know if he would like to set up his therapy for his cervical spine.    He has previously discussed cervical spine surgery with Dr. Gust.  If he does not see improvement with continued injections and physical therapy, he would like to further discuss surgical options.  Depression screening, including interpretation, was performed.  Total time spent performing and reviewing the screening was > 5 minutes.  Questions were encouraged and answered today. Instructed to call with any new questions or concerns. Greater than 30 minutes was spent today reviewing patient chart, reviewing history,  physical exam, education and counseling, medication management and documentation.   Follow-up in 6 to 8 weeks with Dr. Gust to check on his progress of his lumbar spine and cervical spine with physical therapy.  If he does not see significant improvement in his neck pain with therapy he would like to further discuss surgical options.  Risks, benefits, and alternatives of the medications and treatment plan prescribed today were discussed, and patient expressed understanding. Plan follow-up as discussed or as needed if any worsening symptoms or change in condition.          "

## 2024-09-29 DIAGNOSIS — F4312 Post-traumatic stress disorder, chronic: Secondary | ICD-10-CM | POA: Diagnosis not present

## 2024-09-29 DIAGNOSIS — F429 Obsessive-compulsive disorder, unspecified: Secondary | ICD-10-CM | POA: Diagnosis not present

## 2024-10-11 DIAGNOSIS — L659 Nonscarring hair loss, unspecified: Secondary | ICD-10-CM | POA: Diagnosis not present

## 2024-10-11 DIAGNOSIS — F900 Attention-deficit hyperactivity disorder, predominantly inattentive type: Secondary | ICD-10-CM | POA: Diagnosis not present

## 2024-10-11 DIAGNOSIS — L930 Discoid lupus erythematosus: Secondary | ICD-10-CM | POA: Diagnosis not present

## 2024-10-11 DIAGNOSIS — N401 Enlarged prostate with lower urinary tract symptoms: Secondary | ICD-10-CM | POA: Diagnosis not present

## 2024-10-14 DIAGNOSIS — F4312 Post-traumatic stress disorder, chronic: Secondary | ICD-10-CM | POA: Diagnosis not present

## 2024-10-14 DIAGNOSIS — F329 Major depressive disorder, single episode, unspecified: Secondary | ICD-10-CM | POA: Diagnosis not present

## 2024-10-21 DIAGNOSIS — L930 Discoid lupus erythematosus: Secondary | ICD-10-CM | POA: Diagnosis not present

## 2024-10-21 DIAGNOSIS — N401 Enlarged prostate with lower urinary tract symptoms: Secondary | ICD-10-CM | POA: Diagnosis not present

## 2024-10-21 DIAGNOSIS — L659 Nonscarring hair loss, unspecified: Secondary | ICD-10-CM | POA: Diagnosis not present

## 2024-10-21 DIAGNOSIS — F900 Attention-deficit hyperactivity disorder, predominantly inattentive type: Secondary | ICD-10-CM | POA: Diagnosis not present

## 2024-11-03 ENCOUNTER — Inpatient Hospital Stay: Attending: Internal Medicine

## 2024-11-03 ENCOUNTER — Inpatient Hospital Stay (HOSPITAL_BASED_OUTPATIENT_CLINIC_OR_DEPARTMENT_OTHER): Admitting: Internal Medicine

## 2024-11-03 VITALS — BP 115/71 | HR 71 | Temp 97.8°F | Resp 17 | Ht 70.0 in | Wt 159.0 lb

## 2024-11-03 DIAGNOSIS — D708 Other neutropenia: Secondary | ICD-10-CM

## 2024-11-03 DIAGNOSIS — D72819 Decreased white blood cell count, unspecified: Secondary | ICD-10-CM | POA: Diagnosis present

## 2024-11-03 DIAGNOSIS — L930 Discoid lupus erythematosus: Secondary | ICD-10-CM | POA: Insufficient documentation

## 2024-11-03 LAB — CMP (CANCER CENTER ONLY)
ALT: 27 U/L (ref 0–44)
AST: 29 U/L (ref 15–41)
Albumin: 4.3 g/dL (ref 3.5–5.0)
Alkaline Phosphatase: 73 U/L (ref 38–126)
Anion gap: 9 (ref 5–15)
BUN: 17 mg/dL (ref 8–23)
CO2: 27 mmol/L (ref 22–32)
Calcium: 9.5 mg/dL (ref 8.9–10.3)
Chloride: 100 mmol/L (ref 98–111)
Creatinine: 1.17 mg/dL (ref 0.61–1.24)
GFR, Estimated: >60 mL/min
Glucose, Bld: 99 mg/dL (ref 70–99)
Potassium: 4.1 mmol/L (ref 3.5–5.1)
Sodium: 136 mmol/L (ref 135–145)
Total Bilirubin: 0.4 mg/dL (ref 0.0–1.2)
Total Protein: 7.7 g/dL (ref 6.5–8.1)

## 2024-11-03 LAB — CBC WITH DIFFERENTIAL (CANCER CENTER ONLY)
Abs Immature Granulocytes: 0.02 K/uL (ref 0.00–0.07)
Basophils Absolute: 0 K/uL (ref 0.0–0.1)
Basophils Relative: 0 %
Eosinophils Absolute: 0.1 K/uL (ref 0.0–0.5)
Eosinophils Relative: 2 %
HCT: 42.1 % (ref 39.0–52.0)
Hemoglobin: 13.7 g/dL (ref 13.0–17.0)
Immature Granulocytes: 1 %
Lymphocytes Relative: 28 %
Lymphs Abs: 0.8 K/uL (ref 0.7–4.0)
MCH: 27 pg (ref 26.0–34.0)
MCHC: 32.5 g/dL (ref 30.0–36.0)
MCV: 83 fL (ref 80.0–100.0)
Monocytes Absolute: 0.3 K/uL (ref 0.1–1.0)
Monocytes Relative: 10 %
Neutro Abs: 1.8 K/uL (ref 1.7–7.7)
Neutrophils Relative %: 59 %
Platelet Count: 284 K/uL (ref 150–400)
RBC: 5.07 MIL/uL (ref 4.22–5.81)
RDW: 14 % (ref 11.5–15.5)
WBC Count: 3 K/uL — ABNORMAL LOW (ref 4.0–10.5)
nRBC: 0 % (ref 0.0–0.2)

## 2024-11-03 LAB — LACTATE DEHYDROGENASE: LDH: 205 U/L (ref 105–235)

## 2024-11-03 NOTE — Progress Notes (Signed)
 "     [facility] [phone]   [phone]  OFFICE PROGRESS NOTE  [name], [place] [phone] [address] [place] [address]  DIAGNOSIS: Chronic leukopenia with WBC of 2.6 and absolute neutrophil count of 1200 since 2009. No recurrent infections, bleeding, or bruising. Differential includes autoimmune disorder, drug-induced leukopenia, or ethnic origin.  Bone marrow biopsy and aspirate in June 2025 showed no concerning findings for MDS or MPN and his condition is likely reactive in nature secondary to autoimmune disorder and he was recently diagnosed with discoid lupus erythematosus  PRIOR THERAPY: None  CURRENT THERAPY: Observation  INTERVAL HISTORY: Sean Baker 64 y.o. male returns to the clinic today for follow-up visit.Discussed the use of AI scribe software for clinical note transcription with the patient, who gave verbal consent to proceed.  History of Present Illness Sean Baker is a 64 year old male with chronic leukocytopenia attributed to discoid lupus erythematosus who presents for hematology follow-up and repeat blood work.  Leukocytopenia has been present since 2009, with recent bone marrow biopsy and aspirate demonstrating no evidence of myelodysplastic syndrome or other marrow pathology. White blood cell count remains persistently low (3.0), without other cytopenias or hematologic abnormalities.  He was recently diagnosed with discoid lupus erythematosus following development of a persistent facial skin lesion, unresponsive to topical therapy, with diagnosis confirmed by skin biopsy. He is managed with methotrexate, titrated from three to five tablets, and folic acid . He has not used topical agents for the skin lesions and is followed by rheumatology.  Since initiation of methotrexate, he reports decreased appetite and unintentional weight loss, which he describes as unusual for him. No other new symptoms are  reported.    MEDICAL HISTORY: Past Medical History:  Diagnosis Date   Asthma    Depression    Eczema    Thyroid  disease     ALLERGIES:  is allergic to latex; tamsulosin hcl; and tuberculin, ppd.  MEDICATIONS:  Current Outpatient Medications  Medication Sig Dispense Refill   folic acid  (FOLVITE ) 1 MG tablet Take 1 mg by mouth daily.     methotrexate (RHEUMATREX) 2.5 MG tablet Take by mouth.     albuterol  (PROVENTIL  HFA;VENTOLIN  HFA) 108 (90 BASE) MCG/ACT inhaler Inhale into the lungs every 6 (six) hours as needed for wheezing or shortness of breath.     ALPRAZolam (XANAX) 0.5 MG tablet Take 0.25-0.5 mg by mouth daily as needed.     ammonium lactate (LAC-HYDRIN) 12 % lotion Apply topically.     amoxicillin -clavulanate (AUGMENTIN ) 875-125 MG tablet Take 1 tablet by mouth every 12 (twelve) hours. 14 tablet 0   amphetamine-dextroamphetamine (ADDERALL) 10 MG tablet Take 10 mg by mouth daily with breakfast.     Ascorbic Acid (VITA-C PO) Take by mouth daily.     betamethasone  dipropionate 0.05 % lotion Apply topically as needed.     COD LIVER OIL PO Take by mouth daily.     FLUoxetine (PROZAC) 20 MG capsule Take by mouth.     fluticasone  furoate-vilanterol (BREO ELLIPTA ) 100-25 MCG/INH AEPB Inhale 1 puff into the lungs daily. 60 each 5   levothyroxine (SYNTHROID, LEVOTHROID) 88 MCG tablet Take 88 mcg by mouth daily before breakfast.     Multiple Vitamin (MULTIVITAMIN) tablet Take 1 tablet by mouth daily.     pantoprazole (PROTONIX) 20 MG tablet Take 20 mg by mouth daily.     predniSONE  (DELTASONE ) 20 MG tablet Take 3 tabs PO daily x 5 days. 15  tablet 0   sildenafil (VIAGRA) 100 MG tablet Take by mouth.     silodosin (RAPAFLO) 8 MG CAPS capsule Take 8 mg by mouth daily.     simvastatin (ZOCOR) 20 MG tablet Take 20 mg by mouth daily. (Patient not taking: Reported on 10/17/2023)     No current facility-administered medications for this visit.    SURGICAL HISTORY:  Past Surgical  History:  Procedure Laterality Date   FOOT SURGERY  1994   NOSE SURGERY  1981    REVIEW OF SYSTEMS:  A comprehensive review of systems was negative except for: Constitutional: positive for fatigue Integument/breast: positive for skin lesion(s)   PHYSICAL EXAMINATION: General appearance: alert, cooperative, fatigued, and no distress Head: Normocephalic, without obvious abnormality, atraumatic Neck: no adenopathy, no JVD, supple, symmetrical, trachea midline, and thyroid  not enlarged, symmetric, no tenderness/mass/nodules Lymph nodes: Cervical, supraclavicular, and axillary nodes normal. Resp: clear to auscultation bilaterally Back: symmetric, no curvature. ROM normal. No CVA tenderness. Cardio: regular rate and rhythm, S1, S2 normal, no murmur, click, rub or gallop GI: soft, non-tender; bowel sounds normal; no masses,  no organomegaly Extremities: extremities normal, atraumatic, no cyanosis or edema  ECOG PERFORMANCE STATUS: 1 - Symptomatic but completely ambulatory  Blood pressure 115/71, pulse 71, temperature 97.8 F (36.6 C), temperature source Temporal, resp. rate 17, height 5' 10 (1.778 m), weight 159 lb (72.1 kg), SpO2 99%.  LABORATORY DATA: Lab Results  Component Value Date   WBC 3.0 (L) 11/03/2024   HGB 13.7 11/03/2024   HCT 42.1 11/03/2024   MCV 83.0 11/03/2024   PLT 284 11/03/2024      Chemistry      Component Value Date/Time   NA 136 11/03/2024 1307   K 4.1 11/03/2024 1307   CL 100 11/03/2024 1307   CO2 27 11/03/2024 1307   BUN 17 11/03/2024 1307   CREATININE 1.17 11/03/2024 1307      Component Value Date/Time   CALCIUM 9.5 11/03/2024 1307   ALKPHOS 73 11/03/2024 1307   AST 29 11/03/2024 1307   ALT 27 11/03/2024 1307   BILITOT 0.4 11/03/2024 1307       RADIOGRAPHIC STUDIES: No results found.  ASSESSMENT AND PLAN:  This is a very pleasant 80 years African-American male with Chronic leukopenia with WBC of 2.6 and absolute neutrophil count of 1200  since 2009. No recurrent infections, bleeding, or bruising. Differential includes autoimmune disorder, drug-induced leukopenia, or ethnic origin.  The most likely etiology is autoimmune disorder with recent diagnosis of discoid lupus erythematosus. He was recently diagnosed with discoid lupus erythematosus and currently on treatment with methotrexate and folic acid . 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 Bone marrow biopsy excluded myelodysplastic syndrome and other marrow pathology. Leukocyte counts remain low without other cytopenias. Methotrexate therapy for DLE may contribute to leukopenia. - Reviewed laboratory results confirming persistent leukocytopenia without additional cytopenias. - Discussed autoimmune etiology and exclusion of bone marrow failure or malignancy. - Recommended monitoring of blood counts. - Advised hematology follow-up only if requested by other providers or if new symptoms develop.  Discoid lupus erythematosus Diagnosis confirmed by skin biopsy following persistent scalp lesions. He is managed with methotrexate and folic acid , prescribed by rheumatology. Reports decreased appetite and weight loss since initiating methotrexate, likely medication-related. Rheumatology oversees ongoing management. - Confirmed diagnosis via skin biopsy. - Reviewed methotrexate and folic acid  regimen, noting folic acid  is used to  mitigate methotrexate toxicity. - Advised gradual improvement of skin lesions with continued therapy. - Encouraged ongoing rheumatology follow-up for disease management and monitoring of methotrexate side effects, including weight loss and decreased appetite. The patient voices understanding of current disease status and treatment options and is in agreement with the current care plan.  All questions were  answered. The patient knows to call the clinic with any problems, questions or concerns. We can certainly see the patient much sooner if necessary. The total time spent in the appointment was 20 minutes including review of chart and various tests results, discussions about plan of care and coordination of care plan .   Disclaimer: This note was dictated with voice recognition software. Similar sounding words can inadvertently be transcribed and may not be corrected upon review.        "
